# Patient Record
Sex: Female | Born: 1990
Health system: Southern US, Community
[De-identification: ages and names within clinical notes are randomized; demographics above are authoritative.]

## PROBLEM LIST (undated history)

## (undated) DIAGNOSIS — Z3490 Encounter for supervision of normal pregnancy, unspecified, unspecified trimester: Secondary | ICD-10-CM

## (undated) DIAGNOSIS — R7309 Other abnormal glucose: Secondary | ICD-10-CM

## (undated) DIAGNOSIS — E039 Hypothyroidism, unspecified: Secondary | ICD-10-CM

## (undated) DIAGNOSIS — O24419 Gestational diabetes mellitus in pregnancy, unspecified control: Secondary | ICD-10-CM

## (undated) DIAGNOSIS — F909 Attention-deficit hyperactivity disorder, unspecified type: Secondary | ICD-10-CM

## (undated) HISTORY — PX: NO PAST SURGERIES: SHX2092

## (undated) HISTORY — DX: Gestational diabetes mellitus in pregnancy, unspecified control: O24.419

---
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
## 2014-09-22 ENCOUNTER — Other Ambulatory Visit: Payer: Self-pay | Admitting: Family Medicine

## 2014-09-22 DIAGNOSIS — R17 Unspecified jaundice: Secondary | ICD-10-CM

## 2014-09-26 ENCOUNTER — Encounter (INDEPENDENT_AMBULATORY_CARE_PROVIDER_SITE_OTHER): Payer: Self-pay

## 2014-09-26 ENCOUNTER — Ambulatory Visit
Admission: RE | Admit: 2014-09-26 | Discharge: 2014-09-26 | Disposition: A | Discharge status: Home / Self Care | Payer: BC Managed Care – PPO | Source: Ambulatory Visit | Attending: Family Medicine | Admitting: Family Medicine

## 2014-09-26 DIAGNOSIS — R17 Unspecified jaundice: Secondary | ICD-10-CM

## 2015-04-07 ENCOUNTER — Emergency Department (HOSPITAL_COMMUNITY)
Admission: EM | Admit: 2015-04-07 | Discharge: 2015-04-07 | Disposition: A | Discharge status: Home / Self Care | Payer: BC Managed Care – PPO | Attending: Emergency Medicine | Admitting: Emergency Medicine

## 2015-04-07 ENCOUNTER — Encounter (HOSPITAL_COMMUNITY): Payer: Self-pay | Admitting: Emergency Medicine

## 2015-04-07 DIAGNOSIS — F10959 Alcohol use, unspecified with alcohol-induced psychotic disorder, unspecified: Secondary | ICD-10-CM

## 2015-04-07 DIAGNOSIS — F1310 Sedative, hypnotic or anxiolytic abuse, uncomplicated: Secondary | ICD-10-CM | POA: Diagnosis not present

## 2015-04-07 DIAGNOSIS — F1010 Alcohol abuse, uncomplicated: Secondary | ICD-10-CM | POA: Diagnosis present

## 2015-04-07 LAB — COMPREHENSIVE METABOLIC PANEL
ALK PHOS: 71 U/L (ref 39–117)
ALT: 20 U/L (ref 0–35)
AST: 29 U/L (ref 0–37)
Albumin: 4.8 g/dL (ref 3.5–5.2)
Anion gap: 9 (ref 5–15)
BILIRUBIN TOTAL: 0.5 mg/dL (ref 0.3–1.2)
BUN: 8 mg/dL (ref 6–23)
CHLORIDE: 114 mmol/L — AB (ref 96–112)
CO2: 24 mmol/L (ref 19–32)
CREATININE: 0.6 mg/dL (ref 0.50–1.10)
Calcium: 8.8 mg/dL (ref 8.4–10.5)
GFR calc Af Amer: >90 mL/min (ref >90)
GFR calc non Af Amer: >90 mL/min (ref >90)
Glucose, Bld: 108 mg/dL — ABNORMAL HIGH (ref 70–99)
Potassium: 3.6 mmol/L (ref 3.5–5.1)
Sodium: 147 mmol/L — ABNORMAL HIGH (ref 135–145)
Total Protein: 7.4 g/dL (ref 6.0–8.3)

## 2015-04-07 LAB — RAPID URINE DRUG SCREEN, HOSP PERFORMED
AMPHETAMINES: NOT DETECTED
BARBITURATES: NOT DETECTED
BENZODIAZEPINES: POSITIVE — AB
Cocaine: NOT DETECTED
Opiates: NOT DETECTED
Tetrahydrocannabinol: NOT DETECTED

## 2015-04-07 LAB — CBC
HCT: 43 % (ref 36.0–46.0)
Hemoglobin: 14.4 g/dL (ref 12.0–15.0)
MCH: 31.8 pg (ref 26.0–34.0)
MCHC: 33.5 g/dL (ref 30.0–36.0)
MCV: 94.9 fL (ref 78.0–100.0)
PLATELETS: 171 10*3/uL (ref 150–400)
RBC: 4.53 MIL/uL (ref 3.87–5.11)
RDW: 13.6 % (ref 11.5–15.5)
WBC: 6.7 10*3/uL (ref 4.0–10.5)

## 2015-04-07 LAB — I-STAT BETA HCG BLOOD, ED (MC, WL, AP ONLY): I-stat hCG, quantitative: <5 m[IU]/mL (ref <5)

## 2015-04-07 LAB — ACETAMINOPHEN LEVEL: Acetaminophen (Tylenol), Serum: <10 ug/mL — ABNORMAL LOW (ref 10–30)

## 2015-04-07 LAB — SALICYLATE LEVEL

## 2015-04-07 LAB — ETHANOL: Alcohol, Ethyl (B): 366 mg/dL — ABNORMAL HIGH (ref 0–9)

## 2015-04-07 MED ORDER — SODIUM CHLORIDE 0.9 % IV BOLUS (SEPSIS)
1000.0000 mL | Freq: Once | INTRAVENOUS | Status: AC
  Administered 2015-04-07: 1000 mL via INTRAVENOUS

## 2015-04-07 MED ORDER — SODIUM CHLORIDE 0.9 % IV SOLN
Freq: Once | INTRAVENOUS | Status: AC
  Administered 2015-04-07: 01:00:00 via INTRAVENOUS

## 2015-04-07 MED ORDER — LORAZEPAM 2 MG/ML IJ SOLN
1.0000 mg | Freq: Once | INTRAMUSCULAR | Status: AC
  Administered 2015-04-07: 1 mg via INTRAVENOUS
  Filled 2015-04-07: qty 1

## 2015-04-07 NOTE — ED Provider Notes (Signed)
CSN: [id number]     Arrival date & time 04/07/15  0015 History   First MD Initiated Contact with Patient 04/07/15 0043     Chief Complaint  Patient presents with  . Withdrawls     (Consider location/radiation/quality/duration/timing/severity/associated sxs/prior Treatment) HPI Comments: Patient is a 24 year old female with no known past medical history who presents to the emergency department via EMS for further evaluation of erratic behavior. Per EMS, patient was found by family acting erratically. This was thought to be secondary to alcohol withdrawal. EMS reports auditory hallucinations. She was given 5 mg midazolam en route. Patient states that she has been binge drinking over the past 4-5 days. She states that she drinks wine, whiskey, and tequila. She cannot specify as to the last time she drank alcohol today. She does report a history of cocaine use, but states that she has not used cocaine in weeks. She denies any marijuana use. She states that she is not in any pain. No SI/HI.  The history is provided by the patient. No language interpreter was used.    History reviewed. No pertinent past medical history. History reviewed. No pertinent past surgical history. History reviewed. No pertinent family history. History  Substance Use Topics  . Smoking status: Not on file  . Smokeless tobacco: Not on file  . Alcohol Use: Yes   OB History    No data available      Review of Systems  Psychiatric/Behavioral: Positive for hallucinations and behavioral problems. Negative for suicidal ideas.  All other systems reviewed and are negative.   Allergies  Gluten meal  Home Medications   Prior to Admission medications   Not on File   BP 101/67 mmHg  Pulse 90  Temp(Src) 98.9 F (37.2 C) (Oral)  Resp 18  SpO2 99%   Physical Exam  Constitutional: She is oriented to person, place, and time. She appears well-developed and well-nourished. No distress.  Nontoxic/nonseptic appearing.  Patient is moving around vigorously in the bed.  HENT:  Head: Normocephalic and atraumatic.  Eyes: Conjunctivae and EOM are normal. No scleral icterus.  Neck: Normal range of motion.  Cardiovascular: Regular rhythm and intact distal pulses.   Tachycardic rate  Pulmonary/Chest: Breath sounds normal. No respiratory distress. She has no wheezes. She has no rales.  Lungs clear bilaterally. Chest expansion symmetric. Mildly hyperventilating while whining.  Abdominal: Soft. There is no tenderness.  Soft, nontender abdomen  Musculoskeletal: Normal range of motion.  Neurological: She is alert and oriented to person, place, and time. She exhibits normal muscle tone. Coordination normal.  GCS 15. Speech is goal oriented. Patient is alert and oriented 4. No focal neurologic deficits appreciated.  Skin: Skin is warm and dry. No rash noted. She is not diaphoretic. No erythema. No pallor.  Psychiatric: Her affect is inappropriate. Her speech is slurred. She is agitated and withdrawn. She expresses no homicidal and no suicidal ideation. She expresses no suicidal plans and no homicidal plans.  Nursing note and vitals reviewed.   ED Course  Procedures (including critical care time) Labs Review Labs Reviewed  ACETAMINOPHEN LEVEL - Abnormal; Notable for the following:    Acetaminophen (Tylenol), Serum <10.0 (*)    All other components within normal limits  COMPREHENSIVE METABOLIC PANEL - Abnormal; Notable for the following:    Sodium 147 (*)    Chloride 114 (*)    Glucose, Bld 108 (*)    All other components within normal limits  ETHANOL - Abnormal; Notable for the following:  Alcohol, Ethyl (B) 366 (*)    All other components within normal limits  CBC  SALICYLATE LEVEL  URINE RAPID DRUG SCREEN (HOSP PERFORMED)  I-STAT BETA HCG BLOOD, ED ([facility], [facility], [facility] ONLY)    Imaging Review No results found.   EKG Interpretation None      MDM   Final diagnoses:  Alcohol use with alcohol-induced  psychotic disorder, with unspecified complication    24 year old female presents to the emergency department claiming to be in withdrawals. Ethanol 366. Symptoms most c/w alcohol induced psychosis. Agitation and whining as well as tachycardia improved with  Ativan. Patient sleeping and in no distress. Plan includes further sobering. Patient signed out to [facility], PA-C at shift change who will follow and disposition appropriately.   Filed Vitals:   04/07/15 0230 04/07/15 0300 04/07/15 0415 04/07/15 0558  BP: 101/67  Pulse: 78 78 69 90  Temp:      TempSrc:   Other (Comment)   Resp: 21   18  SpO2: 92% 92% 98% 99%     Antony Madura, PA-C 04/07/15 1610  Mirian Mo, MD 04/11/15 [phone]

## 2015-04-07 NOTE — Discharge Instructions (Signed)
Alcohol Intoxication °Alcohol intoxication occurs when the amount of alcohol that a person has consumed impairs his or her ability to mentally and physically function. Alcohol directly impairs the normal chemical activity of the brain. Drinking large amounts of alcohol can lead to changes in mental function and behavior, and it can cause many physical effects that can be harmful.  °Alcohol intoxication can range in severity from mild to very severe. Various factors can affect the level of intoxication that occurs, such as the person's age, gender, weight, frequency of alcohol consumption, and the presence of other medical conditions (such as diabetes, seizures, or heart conditions). Dangerous levels of alcohol intoxication may occur when people drink large amounts of alcohol in a short period (binge drinking). Alcohol can also be especially dangerous when combined with certain prescription medicines or "recreational" drugs. °SIGNS AND SYMPTOMS °Some common signs and symptoms of mild alcohol intoxication include: °· Loss of coordination. °· Changes in mood and behavior. °· Impaired judgment. °· Slurred speech. °As alcohol intoxication progresses to more severe levels, other signs and symptoms will appear. These may include: °· Vomiting. °· Confusion and impaired memory. °· Slowed breathing. °· Seizures. °· Loss of consciousness. °DIAGNOSIS  °Your health care provider will take a medical history and perform a physical exam. You will be asked about the amount and type of alcohol you have consumed. Blood tests will be done to measure the concentration of alcohol in your blood. In many places, your blood alcohol level must be lower than 80 mg/dL (0.08%) to legally drive. However, many dangerous effects of alcohol can occur at much lower levels.  °TREATMENT  °People with alcohol intoxication often do not require treatment. Most of the effects of alcohol intoxication are temporary, and they go away as the alcohol naturally  leaves the body. Your health care provider will monitor your condition until you are stable enough to go home. Fluids are sometimes given through an IV access tube to help prevent dehydration.  °HOME CARE INSTRUCTIONS °· Do not drive after drinking alcohol. °· Stay hydrated. Drink enough water and fluids to keep your urine clear or pale yellow. Avoid caffeine.   °· Only take over-the-counter or prescription medicines as directed by your health care provider.   °SEEK MEDICAL CARE IF:  °· You have persistent vomiting.   °· You do not feel better after a few days. °· You have frequent alcohol intoxication. Your health care provider can help determine if you should see a substance use treatment counselor. °SEEK IMMEDIATE MEDICAL CARE IF:  °· You become shaky or tremble when you try to stop drinking.   °· You shake uncontrollably (seizure).   °· You throw up (vomit) blood. This may be bright red or may look like black coffee grounds.   °· You have blood in your stool. This may be bright red or may appear as a black, tarry, bad smelling stool.   °· You become lightheaded or faint.   °MAKE SURE YOU:  °· Understand these instructions. °· Will watch your condition. °· Will get help right away if you are not doing well or get worse. °Document Released: 09/07/2005 Document Revised: 07/31/2013 Document Reviewed: 05/03/2013 °ExitCare® Patient Information ©2015 [employer]. This information is not intended to replace advice given to you by your health care provider. Make sure you discuss any questions you have with your health care provider. ° °How Much is Too Much Alcohol? °Drinking too much alcohol can cause injury, accidents, and health problems. These types of problems can include:  °· Car   crashes.  Falls.  Family fighting (domestic violence).  Drowning.  Fights.  Injuries.  Burns.  Damage to certain organs.  Having a baby with birth defects. ONE DRINK CAN BE TOO MUCH WHEN YOU ARE:  Working.  Pregnant  or breastfeeding.  Taking medicines. Ask your doctor.  Driving or planning to drive. WHAT IS A STANDARD DRINK?   1 regular beer (12 ounces or 360 milliliters).  1 glass of wine (5 ounces or 150 milliliters).  1 shot of liquor (1.5 ounces or 45 milliliters). BLOOD ALCOHOL LEVELS   .00 A person is sober.  Marland Kitchen.03 A person has no trouble keeping balance, talking, or seeing right, but a "buzz" may be felt.  Marland Kitchen.05 A person feels "buzzed" and relaxed.  Marland Kitchen.08 or .10  A person is drunk. He or she has trouble talking, seeing right, and keeping his or her balance.  .15 A person loses body control and may pass out (blackout).  .20 A person has trouble walking (staggering) and throws up (vomits).  .30 A person will pass out (unconscious).  .40+ A person will be in a coma. Death is possible. If you or someone you know has a drinking problem, get help from a doctor.  Document Released: 09/24/2009 Document Revised: 02/20/2012 Document Reviewed: 09/24/2009 [facility] Patient Information 2015 [place]. This information is not intended to replace advice given to you by your health care provider. Make sure you discuss any questions you have with your health care provider.

## 2015-04-07 NOTE — ED Provider Notes (Signed)
  Patient care [facility] Humes, [place]-C.  Results for orders placed or performed during the hospital encounter of 04/07/15  Acetaminophen level  Result Value Ref Range   Acetaminophen (Tylenol), Serum <10.0 (L) 10 - 30 ug/mL  CBC  Result Value Ref Range   WBC 6.7 4.0 - 10.5 K/uL   RBC 4.53 3.87 - 5.11 MIL/uL   Hemoglobin 14.4 12.0 - 15.0 g/dL   HCT 11.943.0 14.736.0 - 82.946.0 %   MCV 94.9 78.0 - 100.0 fL   MCH 31.8 26.0 - 34.0 pg   MCHC 33.5 30.0 - 36.0 g/dL   RDW 56.213.6 13.011.5 - 86.515.5 %   Platelets 171 150 - 400 K/uL  Comprehensive metabolic panel  Result Value Ref Range   Sodium 147 (H) 135 - 145 mmol/L   Potassium 3.6 3.5 - 5.1 mmol/L   Chloride 114 (H) 96 - 112 mmol/L   CO2 24 19 - 32 mmol/L   Glucose, Bld 108 (H) 70 - 99 mg/dL   BUN 8 6 - 23 mg/dL   Creatinine, Ser 7.840.60 0.50 - 1.10 mg/dL   Calcium 8.8 8.4 - 69.610.5 mg/dL   Total Protein 7.4 6.0 - 8.3 g/dL   Albumin 4.8 3.5 - 5.2 g/dL   AST 29 0 - 37 U/L   ALT 20 0 - 35 U/L   Alkaline Phosphatase 71 39 - 117 U/L   Total Bilirubin 0.5 0.3 - 1.2 mg/dL   GFR calc non Af Amer >90 >90 mL/min   GFR calc Af Amer >90 >90 mL/min   Anion gap 9 5 - 15  Ethanol (ETOH)  Result Value Ref Range   Alcohol, Ethyl (B) 366 (H) 0 - 9 mg/dL  Salicylate level  Result Value Ref Range   Salicylate Lvl <4.0 2.8 - 20.0 mg/dL  I-Stat Beta hCG blood, ED ([facility], [facility], [facility] only)  Result Value Ref Range   I-stat hCG, quantitative <5.0 <5 mIU/mL   Comment 3           No results found.  Filed Vitals:   04/07/15 0816  BP: 94/53  Pulse: 81  Temp: 98.2 F (36.8 C)  Resp: 16   10:04 AM Patient ambulated to restroom without difficulty.   1. Alcohol use with alcohol-induced psychotic disorder, with unspecified complication    Return precautions discussed. Patient is agreeable to plan. Patient is stable at time of discharge   Francee PiccoloJennifer Yelina Sarratt, PA-C 04/07/15 1604  Mirian MoMatthew Gentry, MD 04/11/15 [phone]

## 2015-04-07 NOTE — ED Notes (Signed)
Patient now awake asking for water. Patient fluid challenged.

## 2015-04-07 NOTE — ED Notes (Addendum)
Pt was found by family acting erratic secondary to alcohol withdrawal. Hx of same. Unknown when the last time she drank. Auditory hallucinations. Also possibly a cocaine user. Given 5mg  midazolam en route.

## 2015-04-07 NOTE — ED Notes (Signed)
Bed: WA17 Expected date:  Expected time:  Means of arrival:  Comments: EMS 56F combative, alcohol detox

## 2015-04-07 NOTE — ED Notes (Signed)
Attempted to two person assist and ambulate patient. Patient unwilling to attempt to ambulate.

## 2015-10-16 IMAGING — US US ABDOMEN LIMITED
1 series · 14 of 25 positions shown · non-contrast
Comparison: None.

CLINICAL DATA: Jaundice.

EXAM:
US ABDOMEN LIMITED - RIGHT UPPER QUADRANT

[Series 1: us abdomen limited · 0.22mm/px · 14 of 43 slices shown]
[im 1/43]
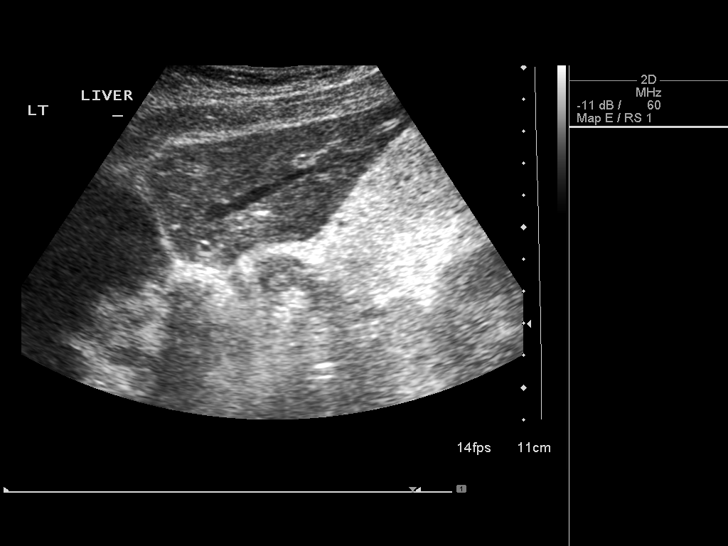
[im 4/43]
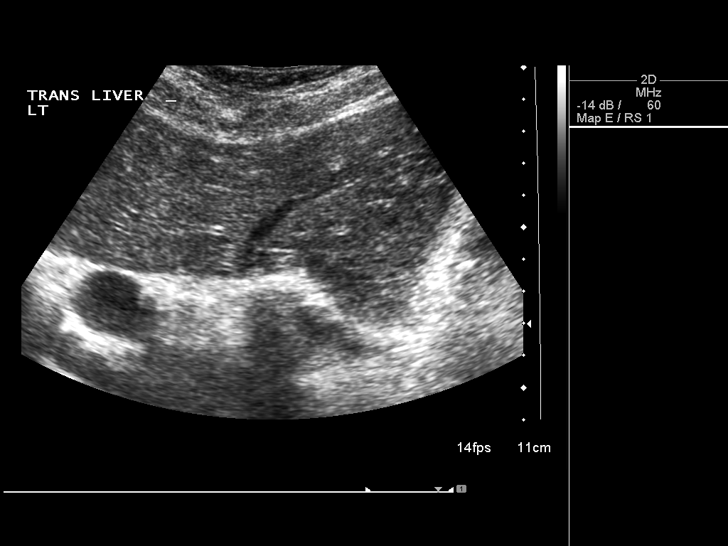
[im 8/43]
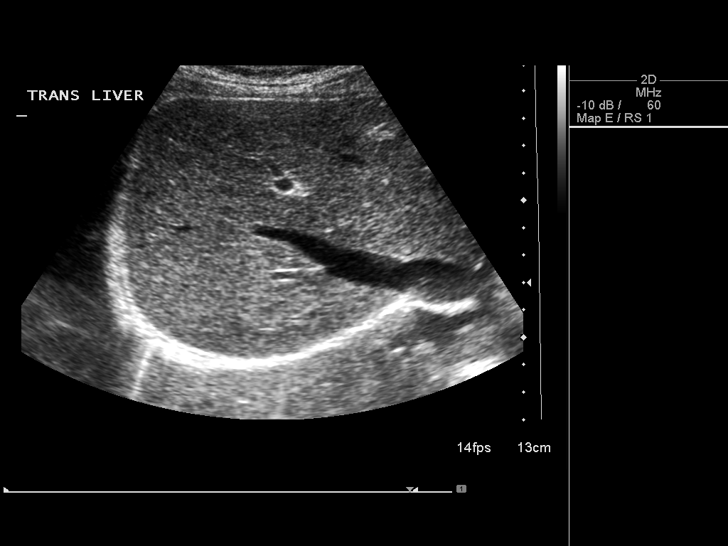
[im 11/43]
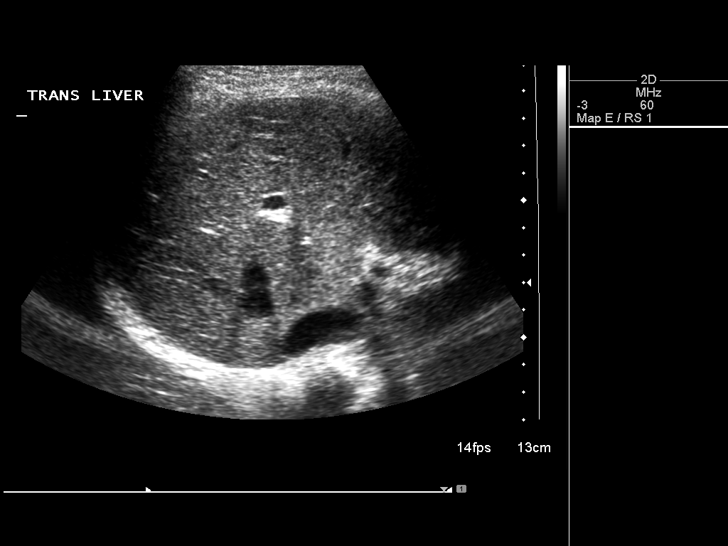
[im 15/43]
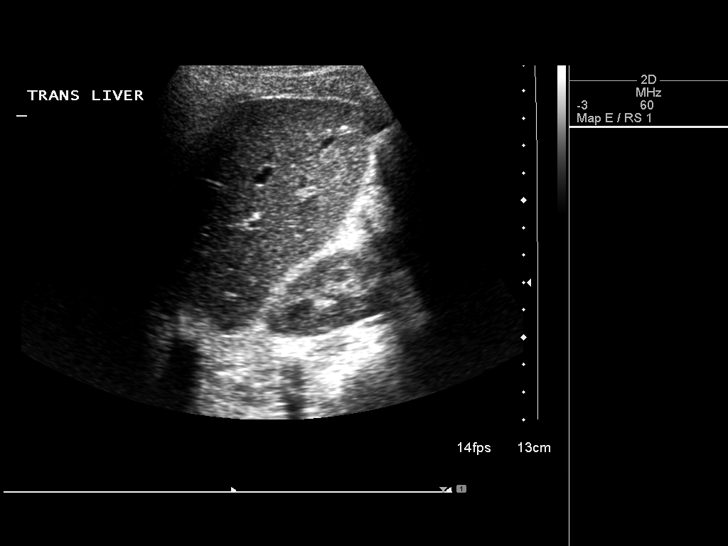
[im 16/43]
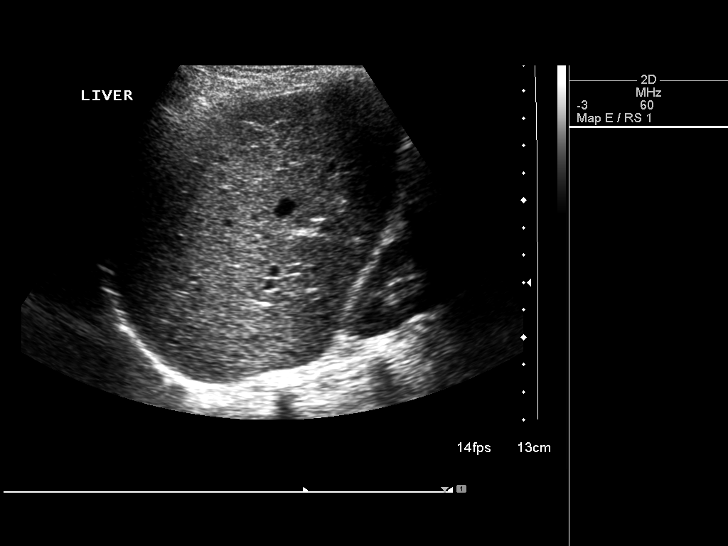
[im 20/43]
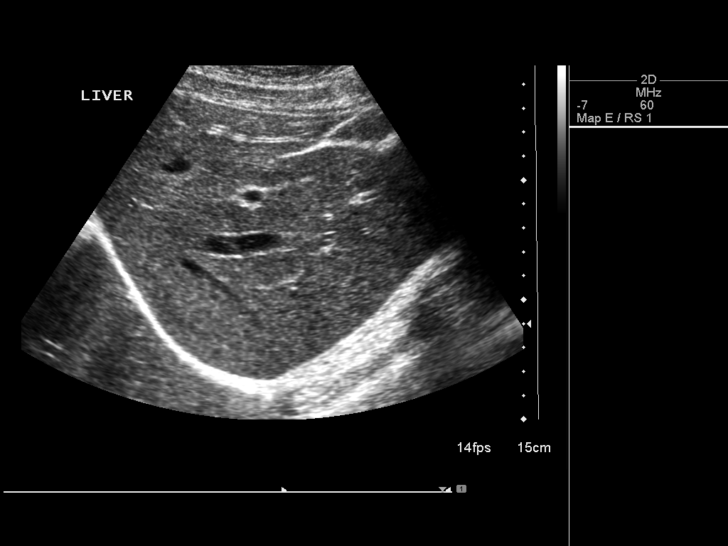
[im 23/43]
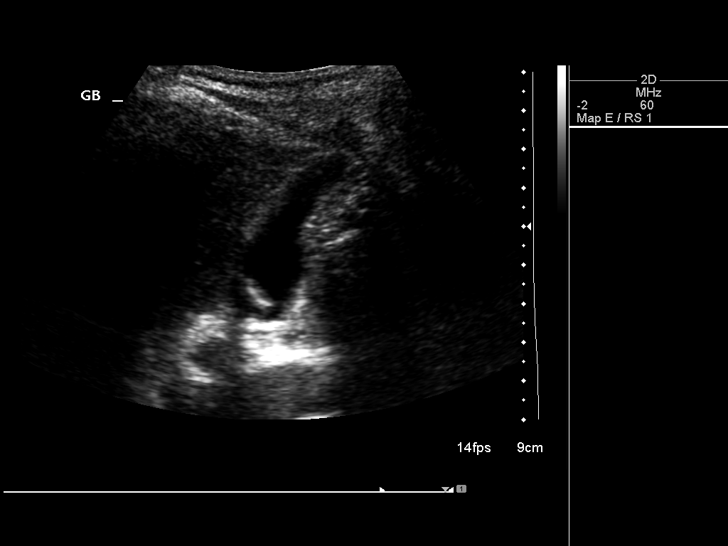
[im 27/43]
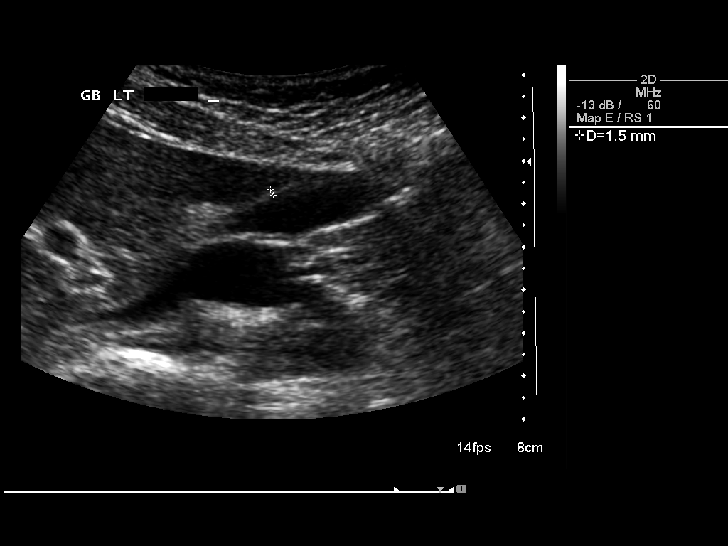
[im 29/43]
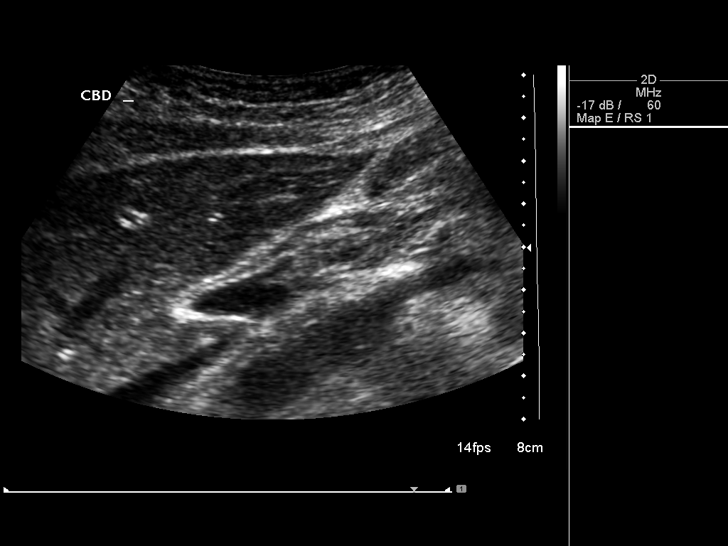
[im 32/43]
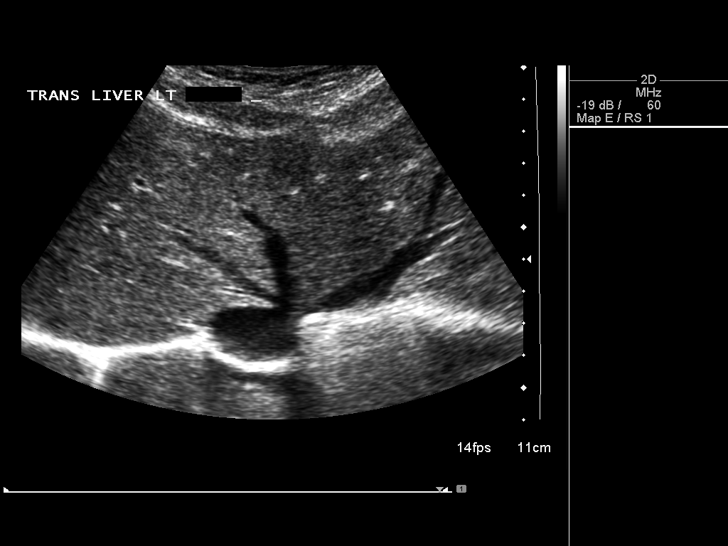
[im 36/43]
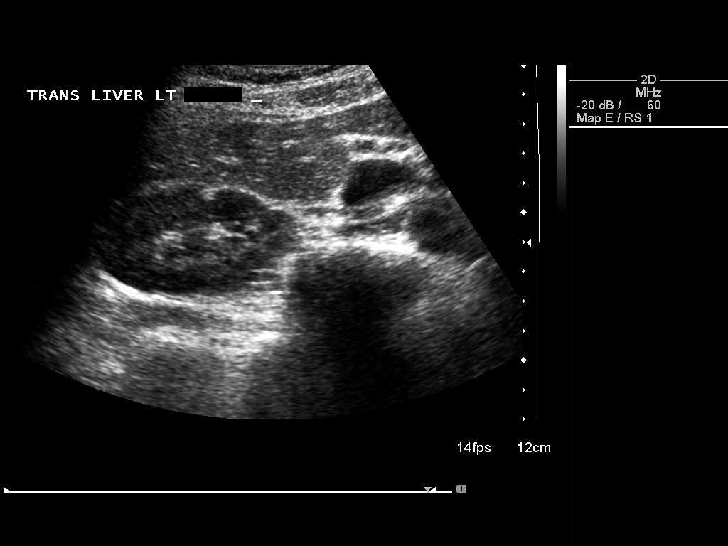
[im 39/43]
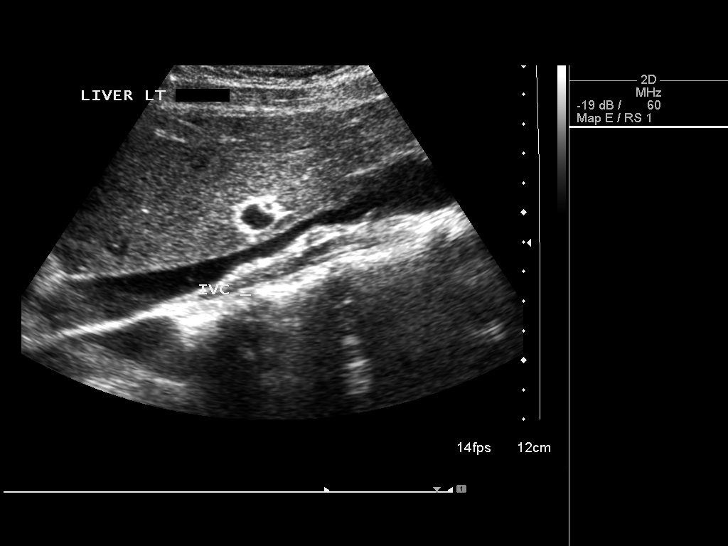
[im 43/43]
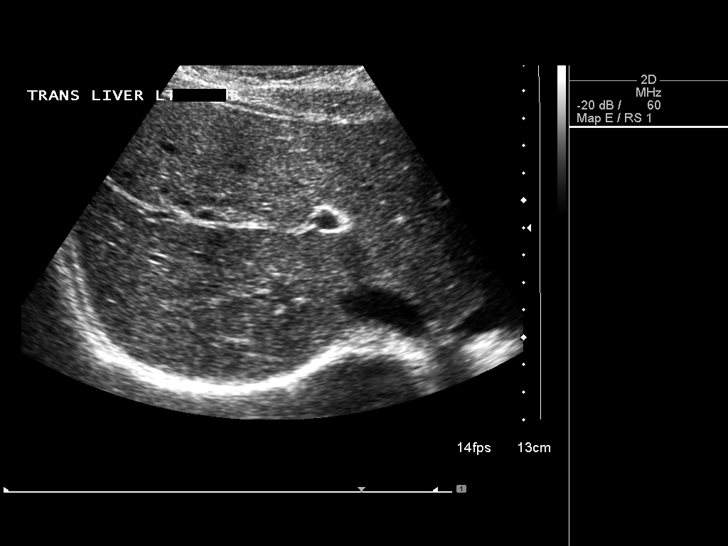

[14 of 25 positions shown; findings below may reference images not displayed]

FINDINGS: Gallbladder:

No gallstones or wall thickening visualized. No sonographic Murphy
sign noted.

Common bile duct:

Diameter: 1.9 mm which is within normal limits.

Liver:

"Starry sky" appearance of hepatic parenchyma is noted which is
nonspecific finding for possible hepatitis. No focal abnormality is
noted.
IMPRESSION: Possible diffuse hepatitis or other hepatocellular disease may be
present. No focal abnormality is noted.

## 2016-12-12 NOTE — L&D Delivery Note (Signed)
Delivery Note At 9:37 PM, on December 11, 2017, a viable female "Jean RosenthalJackson" was delivered via Vaginal, Spontaneous (Presentation: Direct Occiput Anterior with restitution to ROA). After delivery of the head, a shoulder dystocia noted.  Patient informed of dystocia presence and need for maneuvers.  McRoberts performed with good results allowing for anterior shoulder, posterior compound right hand, and posterior shoulder to be delivered.  Delivery of head to body in <45 seconds.  Infant with spontaneous cry and good tone.  Tactile stimulation and bulb suction given by provider before being placed on mother's abdomen were nurses continued tactile stimulation.  Infant APGAR: 8, 9. Cord clamped, cut, and blood collected. Placenta delivered spontaneously and noted to be intact with 3VC upon inspection.  Vaginal inspection revealed a 1st degree perineal and bilateral labial lacerations that was repaired with 3-0 vicryl on CT-1 and SH respectively. ~767mL of 1% Lidocaine necessary for local anesthetic, but patient tolerated the procedure well. Fundus firm, at the umbilicus, and bleeding small.  Mother hemodynamically stable and infant skin to skin prior to provider exit.  Mother desires birth control, but is unsure of method.  Mother opts to breastfeed and family declines infant circumcision.  Infant measurements at one hour of life: 8lbs 3oz, 20.25in  Anesthesia:  Local, Epidural Episiotomy: None Lacerations: Labial;1st degree;Perineal Suture Repair: 3.0 vicryl Est. Blood Loss (mL): 300  Mom to postpartum.  Baby to Couplet care / Skin to Skin.  Cherre RobinsJessica L Aalyah Mansouri MSN, CNM 12/11/2017, 10:19 PM

## 2017-04-26 DIAGNOSIS — Z3490 Encounter for supervision of normal pregnancy, unspecified, unspecified trimester: Secondary | ICD-10-CM | POA: Insufficient documentation

## 2017-04-26 HISTORY — DX: Encounter for supervision of normal pregnancy, unspecified, unspecified trimester: Z34.90

## 2017-04-26 LAB — OB RESULTS CONSOLE HIV ANTIBODY (ROUTINE TESTING): HIV: NONREACTIVE

## 2017-04-26 LAB — OB RESULTS CONSOLE ABO/RH: RH Type: POSITIVE

## 2017-04-26 LAB — OB RESULTS CONSOLE ANTIBODY SCREEN: ANTIBODY SCREEN: NEGATIVE

## 2017-04-26 LAB — OB RESULTS CONSOLE RUBELLA ANTIBODY, IGM: RUBELLA: IMMUNE

## 2017-04-26 LAB — OB RESULTS CONSOLE RPR: RPR: NONREACTIVE

## 2017-04-26 LAB — OB RESULTS CONSOLE GC/CHLAMYDIA
Chlamydia: NEGATIVE
Gonorrhea: NEGATIVE

## 2017-04-26 LAB — OB RESULTS CONSOLE HEPATITIS B SURFACE ANTIGEN: HEP B S AG: NEGATIVE

## 2017-10-02 DIAGNOSIS — R7309 Other abnormal glucose: Secondary | ICD-10-CM | POA: Insufficient documentation

## 2017-10-02 HISTORY — DX: Other abnormal glucose: R73.09

## 2017-12-11 ENCOUNTER — Inpatient Hospital Stay (HOSPITAL_COMMUNITY)
Admission: AD | Admit: 2017-12-11 | Discharge: 2017-12-11 | Disposition: A | Discharge status: Home / Self Care | Payer: BLUE CROSS/BLUE SHIELD | Source: Ambulatory Visit | Attending: Obstetrics & Gynecology | Admitting: Obstetrics & Gynecology

## 2017-12-11 ENCOUNTER — Encounter (HOSPITAL_COMMUNITY): Payer: Self-pay | Admitting: *Deleted

## 2017-12-11 ENCOUNTER — Inpatient Hospital Stay (HOSPITAL_COMMUNITY): Payer: BLUE CROSS/BLUE SHIELD | Admitting: Anesthesiology

## 2017-12-11 ENCOUNTER — Inpatient Hospital Stay (HOSPITAL_COMMUNITY)
Admission: AD | Admit: 2017-12-11 | Discharge: 2017-12-13 | DRG: 807 | Disposition: A | Discharge status: Home / Self Care | Payer: BLUE CROSS/BLUE SHIELD | Source: Ambulatory Visit | Attending: Obstetrics & Gynecology | Admitting: Obstetrics & Gynecology

## 2017-12-11 DIAGNOSIS — Z3A39 39 weeks gestation of pregnancy: Secondary | ICD-10-CM

## 2017-12-11 DIAGNOSIS — Z87891 Personal history of nicotine dependence: Secondary | ICD-10-CM | POA: Diagnosis not present

## 2017-12-11 DIAGNOSIS — Z3483 Encounter for supervision of other normal pregnancy, third trimester: Secondary | ICD-10-CM | POA: Diagnosis present

## 2017-12-11 DIAGNOSIS — O479 False labor, unspecified: Secondary | ICD-10-CM

## 2017-12-11 LAB — CBC
HCT: 39.7 % (ref 36.0–46.0)
Hemoglobin: 14.1 g/dL (ref 12.0–15.0)
MCH: 33.5 pg (ref 26.0–34.0)
MCHC: 35.5 g/dL (ref 30.0–36.0)
MCV: 94.3 fL (ref 78.0–100.0)
PLATELETS: 144 10*3/uL — AB (ref 150–400)
RBC: 4.21 MIL/uL (ref 3.87–5.11)
RDW: 12.9 % (ref 11.5–15.5)
WBC: 13.1 10*3/uL — ABNORMAL HIGH (ref 4.0–10.5)

## 2017-12-11 LAB — TYPE AND SCREEN
ABO/RH(D): O POS
Antibody Screen: NEGATIVE

## 2017-12-11 LAB — ABO/RH: ABO/RH(D): O POS

## 2017-12-11 MED ORDER — EPHEDRINE 5 MG/ML INJ: 10.0000 mg | INTRAVENOUS | Status: DC | PRN

## 2017-12-11 MED ORDER — OXYTOCIN BOLUS FROM INFUSION
500.0000 mL | Freq: Once | INTRAVENOUS | Status: AC
  Administered 2017-12-11: 500 mL via INTRAVENOUS

## 2017-12-11 MED ORDER — IBUPROFEN 600 MG PO TABS
600.0000 mg | ORAL_TABLET | Freq: Four times a day (QID) | ORAL | Status: DC
  Administered 2017-12-12 – 2017-12-13 (×6): 600 mg via ORAL
  Filled 2017-12-11 (×6): qty 1

## 2017-12-11 MED ORDER — DIPHENHYDRAMINE HCL 50 MG/ML IJ SOLN: 12.5000 mg | INTRAMUSCULAR | Status: DC | PRN

## 2017-12-11 MED ORDER — SOD CITRATE-CITRIC ACID 500-334 MG/5ML PO SOLN: 30.0000 mL | ORAL | Status: DC | PRN

## 2017-12-11 MED ORDER — FLEET ENEMA 7-19 GM/118ML RE ENEM: 1.0000 | ENEMA | RECTAL | Status: DC | PRN

## 2017-12-11 MED ORDER — DIBUCAINE 1 % RE OINT: 1.0000 "application " | TOPICAL_OINTMENT | RECTAL | Status: DC | PRN

## 2017-12-11 MED ORDER — LACTATED RINGERS IV SOLN
INTRAVENOUS | Status: DC
  Administered 2017-12-11: 1000 mL/h via INTRAVENOUS

## 2017-12-11 MED ORDER — WITCH HAZEL-GLYCERIN EX PADS: 1.0000 "application " | MEDICATED_PAD | CUTANEOUS | Status: DC | PRN

## 2017-12-11 MED ORDER — BENZOCAINE-MENTHOL 20-0.5 % EX AERO
1.0000 "application " | INHALATION_SPRAY | CUTANEOUS | Status: DC | PRN
  Administered 2017-12-12: 1 via TOPICAL
  Filled 2017-12-11: qty 56

## 2017-12-11 MED ORDER — ACETAMINOPHEN 325 MG PO TABS: 650.0000 mg | ORAL_TABLET | ORAL | Status: DC | PRN

## 2017-12-11 MED ORDER — PHENYLEPHRINE 40 MCG/ML (10ML) SYRINGE FOR IV PUSH (FOR BLOOD PRESSURE SUPPORT)
80.0000 ug | PREFILLED_SYRINGE | INTRAVENOUS | Status: DC | PRN
  Filled 2017-12-11: qty 5

## 2017-12-11 MED ORDER — LACTATED RINGERS IV SOLN: 500.0000 mL | INTRAVENOUS | Status: DC | PRN

## 2017-12-11 MED ORDER — ONDANSETRON HCL 4 MG PO TABS: 4.0000 mg | ORAL_TABLET | ORAL | Status: DC | PRN

## 2017-12-11 MED ORDER — SIMETHICONE 80 MG PO CHEW: 80.0000 mg | CHEWABLE_TABLET | ORAL | Status: DC | PRN

## 2017-12-11 MED ORDER — EPHEDRINE 5 MG/ML INJ
10.0000 mg | INTRAVENOUS | Status: DC | PRN
  Filled 2017-12-11: qty 2

## 2017-12-11 MED ORDER — LACTATED RINGERS IV SOLN: 500.0000 mL | Freq: Once | INTRAVENOUS | Status: DC

## 2017-12-11 MED ORDER — PRENATAL MULTIVITAMIN CH
1.0000 | ORAL_TABLET | Freq: Every day | ORAL | Status: DC
  Administered 2017-12-12 – 2017-12-13 (×2): 1 via ORAL
  Filled 2017-12-11 (×2): qty 1

## 2017-12-11 MED ORDER — ZOLPIDEM TARTRATE 5 MG PO TABS: 5.0000 mg | ORAL_TABLET | Freq: Every evening | ORAL | Status: DC | PRN

## 2017-12-11 MED ORDER — LIDOCAINE HCL (PF) 1 % IJ SOLN
30.0000 mL | INTRAMUSCULAR | Status: DC | PRN
  Administered 2017-12-11: 30 mL via SUBCUTANEOUS
  Filled 2017-12-11: qty 30

## 2017-12-11 MED ORDER — ONDANSETRON HCL 4 MG/2ML IJ SOLN
4.0000 mg | INTRAMUSCULAR | Status: DC | PRN
Start: 2017-12-11 — End: 2017-12-13

## 2017-12-11 MED ORDER — LIDOCAINE HCL (PF) 1 % IJ SOLN
INTRAMUSCULAR | Status: DC | PRN
  Administered 2017-12-11: 2 mL
  Administered 2017-12-11: 3 mL
  Administered 2017-12-11: 5 mL

## 2017-12-11 MED ORDER — OXYTOCIN 40 UNITS IN LACTATED RINGERS INFUSION - SIMPLE MED
2.5000 [IU]/h | INTRAVENOUS | Status: DC
  Filled 2017-12-11: qty 1000

## 2017-12-11 MED ORDER — PHENYLEPHRINE 40 MCG/ML (10ML) SYRINGE FOR IV PUSH (FOR BLOOD PRESSURE SUPPORT)
80.0000 ug | PREFILLED_SYRINGE | INTRAVENOUS | Status: DC | PRN
  Administered 2017-12-11: 40 ug via INTRAVENOUS
  Filled 2017-12-11: qty 10
  Filled 2017-12-11: qty 5

## 2017-12-11 MED ORDER — TETANUS-DIPHTH-ACELL PERTUSSIS 5-2.5-18.5 LF-MCG/0.5 IM SUSP: 0.5000 mL | Freq: Once | INTRAMUSCULAR | Status: DC

## 2017-12-11 MED ORDER — PHENYLEPHRINE 40 MCG/ML (10ML) SYRINGE FOR IV PUSH (FOR BLOOD PRESSURE SUPPORT)
80.0000 ug | PREFILLED_SYRINGE | INTRAVENOUS | Status: DC | PRN
Start: 2017-12-11 — End: 2017-12-11

## 2017-12-11 MED ORDER — DIPHENHYDRAMINE HCL 25 MG PO CAPS: 25.0000 mg | ORAL_CAPSULE | Freq: Four times a day (QID) | ORAL | Status: DC | PRN

## 2017-12-11 MED ORDER — COCONUT OIL OIL: 1.0000 "application " | TOPICAL_OIL | Status: DC | PRN

## 2017-12-11 MED ORDER — ONDANSETRON HCL 4 MG/2ML IJ SOLN: 4.0000 mg | Freq: Four times a day (QID) | INTRAMUSCULAR | Status: DC | PRN

## 2017-12-11 MED ORDER — FENTANYL 2.5 MCG/ML BUPIVACAINE 1/10 % EPIDURAL INFUSION (WH - ANES)
14.0000 mL/h | INTRAMUSCULAR | Status: DC | PRN
  Administered 2017-12-11 (×3): 14 mL/h via EPIDURAL
  Filled 2017-12-11 (×2): qty 100

## 2017-12-11 MED ORDER — SENNOSIDES-DOCUSATE SODIUM 8.6-50 MG PO TABS
2.0000 | ORAL_TABLET | ORAL | Status: DC
  Administered 2017-12-13: 2 via ORAL
  Filled 2017-12-11: qty 2

## 2017-12-11 NOTE — MAU Note (Signed)
Pt started having ctx at 0500, no LOF or bleeding.

## 2017-12-11 NOTE — H&P (Addendum)
Andrea Carter is a 26 y.o. female presenting for labor at 39 weeks 2 days EGA.  [id number] .  Patient with contractions and cervical change, now at 4.5 cm dilation.  LMP 03/10/17 EDC 12/15/2017 by LMP c/w first trimester ultrasound.    OB History    Gravida Para Term Preterm AB Living   1             SAB TAB Ectopic Multiple Live Births                 No past medical history on file. No past surgical history on file. Family History: family history is not on file. Social History:  reports that  She quit smoking when she found out she was pregnant.  She reports that she drinks alcohol.  She does not use other drugs.       Maternal Diabetes: No Genetic Screening: Not done.  Maternal Ultrasounds/Referrals: Normal Fetal Ultrasounds or other Referrals:  None Maternal Substance Abuse:  No Significant Maternal Medications:  None Significant Maternal Lab Results:  Lab values include: Group B Strep negative, Other:  Elevated 1hr GCT, patient declined 3 hr  GTT.  Normal hemoglobin A1C.   Other Comments:  None  ROS Constitutional: Denies fevers/chills Cardiovascular: Denies chest pain or palpitations Pulmonary: Denies coughing or wheezing Gastrointestinal: Denies nausea, vomiting or diarrhea Genitourinary: Denies pelvic pain, unusual vaginal bleeding, unusual vaginal discharge, dysuria, urgency or frequency. With contractions.  Musculoskeletal: Denies muscle or joint aches and pain.  Neurology: Denies abnormal sensations such as tingling or numbness.     Maternal Exam:  Uterine Assessment: Contraction strength is firm.  Contraction duration is 5 minutes. Contraction frequency is regular.   Abdomen: Patient reports no abdominal tenderness. Estimated fetal weight is 7.5lbs.   Fetal presentation: vertex  Introitus: Normal vulva. Normal vagina.  Pelvis: adequate for delivery.      Physical Exam  Dilation: 4.5 Effacement (%): 90 Station: -3 Exam by:: Ginnie Smartachel Schmidt RN Blood pressure  126/90, pulse 82.   Weight gain in pregnancy: 29 lbs. BMI 29.  Constitutional: She is oriented to person, place, and time. She appears well-developed and well-nourished.  HENT:  Head: Normocephalic and atraumatic.  Neck: Normal range of motion.  Cardiovascular: Normal rate, regular rhythm and normal heart sounds.   Respiratory: Effort normal and breath sounds normal.  GI: Soft. Genitourinary: Gravid uterus.  Neurological: She is alert and oriented to person, place, and time.  Skin: Skin is warm and dry.  Psychiatric: She has a normal mood and affect. Her behavior is normal.   Prenatal labs: ABO, Rh: O/Positive/-- (05/16 0000) Antibody: Negative (05/16 0000) Rubella: Immune (05/16 0000) RPR: Nonreactive (05/16 0000)  HBsAg: Negative (05/16 0000)  HIV: Non-reactive (05/16 0000)  GBS:   Negative  S/p TDAP and flu vaccines in office.   Assessment/Plan: 26 y/o G3P0020 @ 39 weeks 2 days EGA here for labor  [facility]  Clear liquid diet Pain management as needed- may get epidural Fetal monitoring Labs: CBC, RPR, type and screen.  Anticipate NSVD.   Konrad FelixKULWA,Edyn Popoca WAKURU , MD.  12/11/2017, 2:20 PM

## 2017-12-11 NOTE — MAU Note (Signed)
I have communicated with Dr Sallye OberKulwa and reviewed vital signs:  Vitals:   12/11/17 0938 12/11/17 1151  BP: 113/77 113/69  Pulse: 83 89  Resp:  18  Temp:      Vaginal exam:  Dilation: 3 Effacement (%): 70 Cervical Position: Middle Station: -3 Presentation: Vertex Exam by:: Ginnie Smartachel Keath Matera RN,   Also reviewed contraction pattern and that non-stress test is reactive.  It has been documented that patient is contracting every 3 minutes with no cervical change over 1.5 hours not indicating active labor.  Patient denies any other complaints.  Based on this report provider has given order for discharge.  A discharge order and diagnosis entered by a provider.   Labor discharge instructions reviewed with patient.

## 2017-12-11 NOTE — Progress Notes (Signed)
Earl GalaSarah Brummond MRN: [id number]  Subjective: -Nurse call reports patient with c/o rectal pressure and VE reveal C/C/0,+1.  In room to assess.  Patient reports rectal pressure with contractions and states that she "could push."  However, after discussion patient admits that she does not have overwhelming urge to push.  Objective: BP (!) 115/59   Pulse (!) 102   Temp (!) 97.4 F (36.3 C) (Axillary)   Ht 5' (1.524 m)   Wt 68 kg (150 lb)   SpO2 100%   BMI 29.29 kg/m  No intake/output data recorded. No intake/output data recorded.  Fetal Monitoring: FHT: 130 bpm, Mod Var, +Early Decels, +Accels UC: palpates strong    Vaginal Exam: SVE:   Dilation: 10 Effacement (%): 100 Station: 0 Exam by:: Minus Libertyhristy Leshowitz, RN Membranes:AROM x 2 hrs Internal Monitors: None  Augmentation/Induction: Pitocin:None Cytotec: None  Assessment:  IUP at 39.3wks Cat I FT  2nd Stage Labor Suspected OP positioning per Dr. Sallyanne HaversEK verbal report  Plan: -Discussed Dr. Sallyanne HaversEK suspicion of OP position and discussed difficulties associated with pushing with infant in said position. -Discussed need to be experiencing constant rectal pressure that increases with contractions -Patient verbalized understanding -Labor down x 1 hour  -Place on peanut and turn q 30 min to promote fetal rotation and descent -Dr. Carmela HurtE. Kulwa updated on patient status and advises 1.5 hours to labor down if necessary -Continue other mgmt as ordered  Valma CavaJessica L Althea Backs,MSN, CNM 12/11/2017, 7:45 PM

## 2017-12-11 NOTE — Anesthesia Preprocedure Evaluation (Signed)
Anesthesia Evaluation  Patient identified by MRN, date of birth, ID band Patient awake    Reviewed: Allergy & Precautions, Patient's Chart, lab work & pertinent test results  Airway Mallampati: II  TM Distance: >3 FB     Dental   Pulmonary neg pulmonary ROS,    Pulmonary exam normal        Cardiovascular negative cardio ROS Normal cardiovascular exam     Neuro/Psych negative neurological ROS     GI/Hepatic negative GI ROS, Neg liver ROS,   Endo/Other  negative endocrine ROS  Renal/GU negative Renal ROS     Musculoskeletal   Abdominal   Peds  Hematology  (+) Blood dyscrasia (Thrombocytopenia), ,   Anesthesia Other Findings   Reproductive/Obstetrics                             Lab Results  Component Value Date   WBC 13.1 (H) 12/11/2017   HGB 14.1 12/11/2017   HCT 39.7 12/11/2017   MCV 94.3 12/11/2017   PLT 144 (L) 12/11/2017    Anesthesia Physical Anesthesia Plan  ASA: II  Anesthesia Plan: Epidural   Post-op Pain Management:    Induction:   PONV Risk Score and Plan: Treatment may vary due to age or medical condition  Airway Management Planned: Natural Airway  Additional Equipment:   Intra-op Plan:   Post-operative Plan:   Informed Consent: I have reviewed the patients History and Physical, chart, labs and discussed the procedure including the risks, benefits and alternatives for the proposed anesthesia with the patient or authorized representative who has indicated his/her understanding and acceptance.     Plan Discussed with:   Anesthesia Plan Comments:         Anesthesia Quick Evaluation

## 2017-12-11 NOTE — Progress Notes (Signed)
Andrea GalaSarah Vanegas is a 26 y.o. G1P0 at 7977w3d admitted for active labor  Subjective: Patient is comfortable with epidural.   Objective: BP 113/89   Pulse 100   Temp (!) 97.4 F (36.3 C) (Axillary)   Ht 5' (1.524 m)   Wt 68 kg (150 lb)   SpO2 100%   BMI 29.29 kg/m  No intake/output data recorded. No intake/output data recorded.  FHT:  FHR: 120 bpm, variability: moderate,  accelerations:  Present,  decelerations:  Absent UC:   regular, every 2 to 5 minutes SVE:   Dilation: 7 Effacement (%): 100 Station: -1 Exam by:: Dr Sallye OberKulwa.  AROM performed, clear, copious fluid.   Labs: Lab Results  Component Value Date   WBC 13.1 (H) 12/11/2017   HGB 14.1 12/11/2017   HCT 39.7 12/11/2017   MCV 94.3 12/11/2017   PLT 144 (L) 12/11/2017    Assessment / Plan: Spontaneous labor, progressing normally,  Labor: Progressing normally and AROM performed.  Fetal Wellbeing:  Category I. Pain Control:  Epidural. I/D:  GBS negative. Anticipated MOD:  NSVD.  Konrad FelixKULWA,Catheline Hixon WAKURU, MD.  12/11/2017, 5:23 PM

## 2017-12-11 NOTE — Anesthesia Pain Management Evaluation Note (Signed)
  [facility] Pain Management Visit Note  Patient: Andrea GalaSarah Carter, 26 y.o., female  "Hello I am a [facility] of the anesthesia [facility]. We have an anesthesia team available at all times to provide care throughout the hospital, including epidural management and anesthesia for C-section. I don't know your plan for the delivery whether it a natural birth, water birth, IV sedation, nitrous supplementation, doula or epidural, but we want to meet your pain goals."   1.Was your pain managed to your expectations on prior hospitalizations?   No prior hospitalizations  2.What is your expectation for pain management during this hospitalization?     Epidural  3.How can we help you reach that goal? epidural  Record the patient's initial score and the patient's pain goal.   Pain: 10  Pain Goal: 5 [facility] wants you to be able to say your pain was always managed very well.  Kathlen Sakurai 12/11/2017

## 2017-12-11 NOTE — Anesthesia Procedure Notes (Signed)
Epidural Patient location during procedure: OB Start time: 12/11/2017 3:18 PM End time: 12/11/2017 3:28 PM  Staffing Anesthesiologist: Marcene DuosFitzgerald, Loni Abdon, MD  Preanesthetic Checklist Completed: patient identified, site marked, surgical consent, pre-op evaluation, timeout performed, IV checked, risks and benefits discussed and monitors and equipment checked  Epidural Patient position: sitting Prep: site prepped and draped and DuraPrep Patient monitoring: continuous pulse ox and blood pressure Approach: midline Location: L3-L4 Injection technique: LOR air  Needle:  Needle type: Tuohy  Needle gauge: 17 G Needle length: 9 cm and 9 Needle insertion depth: 6 cm Catheter type: closed end flexible Catheter size: 19 Gauge Catheter at skin depth: 11 cm Test dose: negative  Assessment Events: blood not aspirated, injection not painful, no injection resistance, negative IV test and no paresthesia

## 2017-12-12 LAB — CBC
HCT: 34.7 % — ABNORMAL LOW (ref 36.0–46.0)
Hemoglobin: 11.9 g/dL — ABNORMAL LOW (ref 12.0–15.0)
MCH: 32.5 pg (ref 26.0–34.0)
MCHC: 34.3 g/dL (ref 30.0–36.0)
MCV: 94.8 fL (ref 78.0–100.0)
PLATELETS: 151 10*3/uL (ref 150–400)
RBC: 3.66 MIL/uL — ABNORMAL LOW (ref 3.87–5.11)
RDW: 13 % (ref 11.5–15.5)
WBC: 12.2 10*3/uL — AB (ref 4.0–10.5)

## 2017-12-12 LAB — RPR: RPR: NONREACTIVE

## 2017-12-12 NOTE — Anesthesia Postprocedure Evaluation (Signed)
Anesthesia Post Note  Patient: Andrea GalaSarah Carter  Procedure(s) Performed: AN AD HOC LABOR EPIDURAL     Patient location during evaluation: Mother Baby Anesthesia Type: Epidural Level of consciousness: awake and alert and oriented Pain management: pain level controlled Vital Signs Assessment: post-procedure vital signs reviewed and stable Respiratory status: spontaneous breathing and nonlabored ventilation Cardiovascular status: stable Postop Assessment: no headache, patient able to bend at knees, no backache, no apparent nausea or vomiting, epidural receding and adequate PO intake Anesthetic complications: no    Last Vitals:  Vitals:   12/12/17 0040 12/12/17 0500  BP: (!) 103/57 (!) 104/58  Pulse: 62 80  Resp: 18 18  Temp: 36.9 C 36.7 C  SpO2: 100%     Last Pain:  Vitals:   12/12/17 0500  TempSrc: Oral  PainSc: 0-No pain   Pain Goal:                 Land O'LakesMalinova,Sebastin Perlmutter Hristova

## 2017-12-12 NOTE — Progress Notes (Signed)
Post Partum Day 1 Subjective: no complaints  Objective: Blood pressure (!) 104/58, pulse 80, temperature 98.1 F (36.7 C), temperature source Oral, resp. rate 18, height 5' (1.524 m), weight 150 lb (68 kg), SpO2 100 %, unknown if currently breastfeeding.  Physical Exam:  General: alert and cooperative Lochia: appropriate Uterine Fundus: firm Incision:  DVT Evaluation: No evidence of DVT seen on physical exam.  Recent Labs    12/11/17 1435 12/12/17 0459  HGB 14.1 11.9*  HCT 39.7 34.7*    Assessment/Plan: Plan for discharge tomorrow and Breastfeeding   LOS: 1 day   Lori A Clemmons CNM 12/12/2017, 10:07 AM

## 2017-12-12 NOTE — Lactation Note (Signed)
This note was copied from a baby's chart. Lactation Consultation Note  Patient Name: Andrea Earl GalaSarah Molden ZOXWR'UToday's Date: 12/12/2017 Reason for consult: Follow-up assessment;Term  Baby is 6121 hours old  LC reviewed and updated the doc flow sheets.  Mom called and requested assistance.  Dad changing a wet diaper ( large when LC entered the room)  LC assisted to latch the baby on the right breast / football,  Breast massage , hand express( several drops of colostrum noted )  Baby latched easily with depth and sustained the latch for 12 mins , with  Multiple swallows, increased with breast compressions, and released on his own  And was content.  Baby STS on the moms chest, asleep after feeding.  LC reassured mom to continue with spoon feeding prior to latch or afterwards.     Maternal Data Has patient been taught Hand Expression?: Yes  Feeding Feeding Type: Breast Fed Length of feed: 12 min(multiple swallows,increased compressions )  LATCH Score Latch: Grasps breast easily, tongue down, lips flanged, rhythmical sucking.  Audible Swallowing: Spontaneous and intermittent  Type of Nipple: Everted at rest and after stimulation  Comfort (Breast/Nipple): Soft / non-tender  Hold (Positioning): Assistance needed to correctly position infant at breast and maintain latch.  LATCH Score: 9  Interventions Interventions: Breast feeding basics reviewed;Assisted with latch;Skin to skin;Breast massage;Hand express;Breast compression;Adjust position;Support pillows;Position options  Lactation Tools Discussed/Used WIC Program: No   Consult Status Consult Status: Follow-up Date: 12/13/17 Follow-up type: In-patient    Matilde SprangMargaret Ann Edwin Baines 12/12/2017, 7:12 PM

## 2017-12-12 NOTE — Lactation Note (Signed)
This note was copied from a baby's chart. Lactation Consultation Note  Patient Name: Boy Earl GalaSarah Garland ZOXWR'UToday's Date: 12/12/2017 Reason for consult: Initial assessment;Term  Baby is 6720 hours old LC updated the doc flow sheets per mom.  The baby has had several attempts at the breast latching per mom the 1st was the longest one Of 5-10 mins. Other wise has attempted to latch, and of the baby doesn't latch, spoon fed 3- 7.5 ml.  Voided x1 and stool x 1at 20 hours of life.  As LC was in the room for consult, baby sound asleep laying in bed in front of mom, and not showing signs of hunger.  LC encouraged mom to call on the nurses light  with feeding cues for feeding assessment.  RN and the [employer] secretary aware mom will call.  Mother informed of post-discharge support and given phone number to the lactation department, including services for phone call assistance; out-patient appointments; and breastfeeding support group. List of other breastfeeding resources in the community given in the handout. Encouraged mother to call for problems or concerns related to breastfeeding.     Maternal Data Has patient been taught Hand Expression?: Yes  Feeding Feeding Type: Breast Milk  LATCH Score                   Interventions Interventions: Breast feeding basics reviewed  Lactation Tools Discussed/Used     Consult Status Consult Status: Follow-up Date: 12/12/17 Follow-up type: In-patient    Matilde SprangMargaret Ann Gwenneth Whiteman 12/12/2017, 6:19 PM

## 2017-12-13 MED ORDER — IBUPROFEN 600 MG PO TABS: 600.0000 mg | ORAL_TABLET | Freq: Four times a day (QID) | ORAL | 0 refills | Status: DC

## 2017-12-13 NOTE — Lactation Note (Signed)
This note was copied from a baby's chart. Lactation Consultation Note  Patient Name: Boy Earl GalaSarah Danziger ZOXWR'UToday's Date: 12/13/2017 Reason for consult: Follow-up assessment;Term;Infant weight loss(6% weight loss )  Baby  Is 36 hours  LC reviewed doc flow and updated per mom  4 wets and 3 stools updated in doc flow sheets.  @ consult baby awake. LC reviewed basics of latching, assisted  To latch in the cross cradle position. Baby scored 9 , and latched with depth.  Per mom comfortable with latch. With latch LC noted multiple swallows and breast are filling  And warm .  Mom denies soreness , sore nipple and engorgement prevention and tx reviewed.  LC instructed mom on the use of hand pump, checked flange size and the #24 F is fine for today  And when milk comes has the #27 Flange if needed. Instructed mom on the use shells to enhance the  Compressibility of the areola.  Per mom has  DEBP Medela at home.  Discussed nutritive vs non- nutritive sucking patterns and to watch for hanging out latch.  Mother informed of post-discharge support and given phone number to the lactation department, including services for phone call assistance; out-patient appointments; and breastfeeding support group. List of other breastfeeding resources in the community given in the handout. Encouraged mother to call for problems or concerns related to breastfeeding.   Maternal Data Has patient been taught Hand Expression?: Yes  Feeding Feeding Type: Breast Fed Length of feed: 9 min(multiple swallows, increased with breast compressions )  LATCH Score Latch: Grasps breast easily, tongue down, lips flanged, rhythmical sucking.  Audible Swallowing: Spontaneous and intermittent  Type of Nipple: Everted at rest and after stimulation  Comfort (Breast/Nipple): Filling, red/small blisters or bruises, mild/mod discomfort  Hold (Positioning): Assistance needed to correctly position infant at breast and maintain  latch.  LATCH Score: 8  Interventions Interventions: Breast feeding basics reviewed;Assisted with latch;Skin to skin;Breast massage;Hand express;Breast compression;Adjust position;Support pillows;Position options;Shells;Hand pump  Lactation Tools Discussed/Used Tools: Shells;Pump Shell Type: Inverted Breast pump type: Manual   Consult Status Consult Status: Complete Date: 12/13/17    Kathrin GreathouseMargaret Ann Lekita Kerekes 12/13/2017, 9:50 AM

## 2017-12-13 NOTE — Discharge Summary (Signed)
OB Discharge Summary     Patient Name: Andrea Carter DOB: [birth year] MRN: [id number]  Date of admission: 12/11/2017 Delivering MD: Gerrit Heck   Date of discharge: 12/13/2017  Admitting diagnosis: LABOR Intrauterine pregnancy: [redacted]w[redacted]d     Secondary diagnosis:  Active Problems:   SVD (spontaneous vaginal delivery)   Shoulder dystocia, delivered, current hospitalization   Obstetric labial laceration, delivered, current hospitalization   First degree perineal laceration during delivery  Additional problems: None     Discharge diagnosis: Term Pregnancy Delivered                                                                                                Post partum procedures:None  Augmentation: AROM  Complications: None  Hospital course:  Onset of Labor With Vaginal Delivery     27 y.o. yo G1P1001 at [redacted]w[redacted]d was admitted in Active Labor on 12/11/2017. Patient had an uncomplicated labor course as follows:  Membrane Rupture Time/Date: 5:18 PM ,12/11/2017   Intrapartum Procedures: Episiotomy: None [1]                                         Lacerations:  Labial [10];1st degree [2];Perineal [11]  Patient had a delivery of a Viable infant. 12/11/2017  Information for the patient's newborn:  Jameca, Chumley [[id number]]  Delivery Method: Vaginal, Spontaneous(Filed from Delivery Summary)    Pateint had an uncomplicated postpartum course.  She is ambulating, tolerating a regular diet, passing flatus, and urinating well. Patient is discharged home in stable condition on 12/13/17.   Physical exam  Vitals:   12/12/17 0040 12/12/17 0500 12/12/17 1739 12/13/17 0555  BP: (!) 103/57 (!) 104/58 119/66 116/80  Pulse: 62 80 67 66  Resp: 18 18 20 18   Temp: 98.4 F (36.9 C) 98.1 F (36.7 C) 98.3 F (36.8 C) 97.9 F (36.6 C)  TempSrc: Oral Oral Oral Oral  SpO2: 100%     Weight:      Height:       General: alert, cooperative and no distress  Chest: HRRR, Lungs CTA Abd: Soft, NT   Lochia: appropriate Uterine Fundus: firm at umbilicus Incision: N/A DVT Evaluation: No evidence of DVT seen on physical exam. No significant calf/ankle edema. Labs: Lab Results  Component Value Date   WBC 12.2 (H) 12/12/2017   HGB 11.9 (L) 12/12/2017   HCT 34.7 (L) 12/12/2017   MCV 94.8 12/12/2017   PLT 151 12/12/2017   CMP Latest Ref Rng & Units 04/07/2015  Glucose 70 - 99 mg/dL 782(N)  BUN 6 - 23 mg/dL 8  Creatinine 5.62 - 1.30 mg/dL 8.65  Sodium 784 - 696 mmol/L 147(H)  Potassium 3.5 - 5.1 mmol/L 3.6  Chloride 96 - 112 mmol/L 114(H)  CO2 19 - 32 mmol/L 24  Calcium 8.4 - 10.5 mg/dL 8.8  Total Protein 6.0 - 8.3 g/dL 7.4  Total Bilirubin 0.3 - 1.2 mg/dL 0.5  Alkaline Phos 39 - 117 U/L 71  AST 0 - 37 U/L 29  ALT 0 - 35 U/L 20    Discharge instruction: per After Visit Summary and "Baby and Me Booklet". Pain Management, Peri-Care, Breastfeeding, Who and When to call for postpartum complications. Information Sheet(s) given Postpartum Depression and BB, Contraception Choices   After visit meds:  Allergies as of 12/13/2017      Reactions   Gluten Meal Other (See Comments)   Unknown reaction   No Allergies On File       Medication List    TAKE these medications   ibuprofen 600 MG tablet Commonly known as:  ADVIL,MOTRIN Take 1 tablet (600 mg total) by mouth every 6 (six) hours.   prenatal multivitamin Tabs tablet Take 1 tablet by mouth daily at 12 noon.       Diet: routine diet  Activity: Advance as tolerated. Pelvic rest for 6 weeks.   Outpatient follow up:6 weeks Follow up Appt:No future appointments. Follow up Visit:No Follow-up on file.  Postpartum contraception: Undecided  Newborn Data: Live born female - Jean RosenthalJackson Birth Weight: 8 lb 2.7 oz (3705 g) APGAR: 8, 9  Newborn Delivery   Time head delivered:  12/11/2017 21:36:00 Birth date/time:  12/11/2017 21:37:00 Delivery type:  Vaginal, Spontaneous     Baby Feeding: Breast Disposition:home with  mother   12/13/2017 Cherre RobinsJessica L Azani Brogdon, CNM

## 2017-12-13 NOTE — Discharge Instructions (Signed)
Contraception Choices Contraception, also called birth control, refers to methods or devices that prevent pregnancy. Hormonal methods Contraceptive implant A contraceptive implant is a thin, plastic tube that contains a hormone. It is inserted into the upper part of the arm. It can remain in place for up to 3 years. Progestin-only injections Progestin-only injections are injections of progestin, a synthetic form of the hormone progesterone. They are given every 3 months by a health care provider. Birth control pills Birth control pills are pills that contain hormones that prevent pregnancy. They must be taken once a day, preferably at the same time each day. Birth control patch The birth control patch contains hormones that prevent pregnancy. It is placed on the skin and must be changed once a week for three weeks and removed on the fourth week. A prescription is needed to use this method of contraception. Vaginal ring A vaginal ring contains hormones that prevent pregnancy. It is placed in the vagina for three weeks and removed on the fourth week. After that, the process is repeated with a new ring. A prescription is needed to use this method of contraception. Emergency contraceptive Emergency contraceptives prevent pregnancy after unprotected sex. They come in pill form and can be taken up to 5 days after sex. They work best the sooner they are taken after having sex. Most emergency contraceptives are available without a prescription. This method should not be used as your only form of birth control. Barrier methods Female condom A female condom is a thin sheath that is worn over the penis during sex. Condoms keep sperm from going inside a woman's body. They can be used with a spermicide to increase their effectiveness. They should be disposed after a single use. Female condom A female condom is a soft, loose-fitting sheath that is put into the vagina before sex. The condom keeps sperm from going  inside a woman's body. They should be disposed after a single use. Diaphragm A diaphragm is a soft, dome-shaped barrier. It is inserted into the vagina before sex, along with a spermicide. The diaphragm blocks sperm from entering the uterus, and the spermicide kills sperm. A diaphragm should be left in the vagina for 6-8 hours after sex and removed within 24 hours. A diaphragm is prescribed and fitted by a health care provider. A diaphragm should be replaced every 1-2 years, after giving birth, after gaining more than 15 lb (6.8 kg), and after pelvic surgery. Cervical cap A cervical cap is a round, soft latex or plastic cup that fits over the cervix. It is inserted into the vagina before sex, along with spermicide. It blocks sperm from entering the uterus. The cap should be left in place for 6-8 hours after sex and removed within 48 hours. A cervical cap must be prescribed and fitted by a health care provider. It should be replaced every 2 years. Sponge A sponge is a soft, circular piece of polyurethane foam with spermicide on it. The sponge helps block sperm from entering the uterus, and the spermicide kills sperm. To use it, you make it wet and then insert it into the vagina. It should be inserted before sex, left in for at least 6 hours after sex, and removed and thrown away within 30 hours. Spermicides Spermicides are chemicals that kill or block sperm from entering the cervix and uterus. They can come as a cream, jelly, suppository, foam, or tablet. A spermicide should be inserted into the vagina with an applicator at least 10-27 minutes before  sex to allow time for it to work. The process must be repeated every time you have sex. Spermicides do not require a prescription. Intrauterine contraception Intrauterine device (IUD) An IUD is a T-shaped device that is put in a woman's uterus. There are two types:  Hormone IUD.This type contains progestin, a synthetic form of the hormone progesterone. This  type can stay in place for 3-5 years.  Copper IUD.This type is wrapped in copper wire. It can stay in place for 10 years.  Permanent methods of contraception Female tubal ligation In this method, a woman's fallopian tubes are sealed, tied, or blocked during surgery to prevent eggs from traveling to the uterus. Hysteroscopic sterilization In this method, a small, flexible insert is placed into each fallopian tube. The inserts cause scar tissue to form in the fallopian tubes and block them, so sperm cannot reach an egg. The procedure takes about 3 months to be effective. Another form of birth control must be used during those 3 months. Female sterilization This is a procedure to tie off the tubes that carry sperm (vasectomy). After the procedure, the man can still ejaculate fluid (semen). Natural planning methods Natural family planning In this method, a couple does not have sex on days when the woman could become pregnant. Calendar method This means keeping track of the length of each menstrual cycle, identifying the days when pregnancy can happen, and not having sex on those days. Ovulation method In this method, a couple avoids sex during ovulation. Symptothermal method This method involves not having sex during ovulation. The woman typically checks for ovulation by watching changes in her temperature and in the consistency of cervical mucus. Post-ovulation method In this method, a couple waits to have sex until after ovulation. Summary  Contraception, also called birth control, means methods or devices that prevent pregnancy.  Hormonal methods of contraception include implants, injections, pills, patches, vaginal rings, and emergency contraceptives.  Barrier methods of contraception can include female condoms, female condoms, diaphragms, cervical caps, sponges, and spermicides.  There are two types of IUDs (intrauterine devices). An IUD can be put in a woman's uterus to prevent pregnancy  for 3-5 years.  Permanent sterilization can be done through a procedure for males, females, or both.  Natural family planning methods involve not having sex on days when the woman could become pregnant. This information is not intended to replace advice given to you by your health care provider. Make sure you discuss any questions you have with your health care provider. Document Released: 11/28/2005 Document Revised: 12/31/2016 Document Reviewed: 12/31/2016 Elsevier Interactive Patient Education  2018 [employer] Postpartum Depression and Baby Blues The postpartum period begins right after the birth of a baby. During this time, there is often a great amount of joy and excitement. It is also a time of many changes in the life of the parents. Regardless of how many times a mother gives birth, each child brings new challenges and dynamics to the family. It is not unusual to have feelings of excitement along with confusing shifts in moods, emotions, and thoughts. All mothers are at risk of developing postpartum depression or the "baby blues." These mood changes can occur right after giving birth, or they may occur many months after giving birth. The baby blues or postpartum depression can be mild or severe. Additionally, postpartum depression can go away rather quickly, or it can be a long-term condition. What are the causes? Raised hormone levels and the rapid drop in those levels  are thought to be a main cause of postpartum depression and the baby blues. A number of hormones change during and after pregnancy. Estrogen and progesterone usually decrease right after the delivery of your baby. The levels of thyroid hormone and various cortisol steroids also rapidly drop. Other factors that play a role in these mood changes include major life events and genetics. What increases the risk? If you have any of the following risks for the baby blues or postpartum depression, know what symptoms to watch out for  during the postpartum period. Risk factors that may increase the likelihood of getting the baby blues or postpartum depression include:  Having a personal or family history of depression.  Having depression while being pregnant.  Having premenstrual mood issues or mood issues related to oral contraceptives.  Having a lot of life stress.  Having marital conflict.  Lacking a social support network.  Having a baby with special needs.  Having health problems, such as diabetes.  What are the signs or symptoms? Symptoms of baby blues include:  Brief changes in mood, such as going from extreme happiness to sadness.  Decreased concentration.  Difficulty sleeping.  Crying spells, tearfulness.  Irritability.  Anxiety.  Symptoms of postpartum depression typically begin within the first month after giving birth. These symptoms include:  Difficulty sleeping or excessive sleepiness.  Marked weight loss.  Agitation.  Feelings of worthlessness.  Lack of interest in activity or food.  Postpartum psychosis is a very serious condition and can be dangerous. Fortunately, it is rare. Displaying any of the following symptoms is cause for immediate medical attention. Symptoms of postpartum psychosis include:  Hallucinations and delusions.  Bizarre or disorganized behavior.  Confusion or disorientation.  How is this diagnosed? A diagnosis is made by an evaluation of your symptoms. There are no medical or lab tests that lead to a diagnosis, but there are various questionnaires that a health care provider may use to identify those with the baby blues, postpartum depression, or psychosis. Often, a screening tool called the [place] Postnatal Depression Scale is used to diagnose depression in the postpartum period. How is this treated? The baby blues usually goes away on its own in 1-2 weeks. Social support is often all that is needed. You will be encouraged to get adequate sleep and  rest. Occasionally, you may be given medicines to help you sleep. Postpartum depression requires treatment because it can last several months or longer if it is not treated. Treatment may include individual or group therapy, medicine, or both to address any social, physiological, and psychological factors that may play a role in the depression. Regular exercise, a healthy diet, rest, and social support may also be strongly recommended. Postpartum psychosis is more serious and needs treatment right away. Hospitalization is often needed. Follow these instructions at home:  Get as much rest as you can. Nap when the baby sleeps.  Exercise regularly. Some women find yoga and walking to be beneficial.  Eat a balanced and nourishing diet.  Do little things that you enjoy. Have a cup of tea, take a bubble bath, read your favorite magazine, or listen to your favorite music.  Avoid alcohol.  Ask for help with household chores, cooking, grocery shopping, or running errands as needed. Do not try to do everything.  Talk to people close to you about how you are feeling. Get support from your partner, family members, friends, or other new moms.  Try to stay positive in how you think. Think  about the things you are grateful for.  Do not spend a lot of time alone.  Only take over-the-counter or prescription medicine as directed by your health care provider.  Keep all your postpartum appointments.  Let your health care provider know if you have any concerns. Contact a health care provider if: You are having a reaction to or problems with your medicine. Get help right away if:  You have suicidal feelings.  You think you may harm the baby or someone else. This information is not intended to replace advice given to you by your health care provider. Make sure you discuss any questions you have with your health care provider. Document Released: 09/01/2004 Document Revised: 05/05/2016 Document Reviewed:  09/09/2013 Elsevier Interactive Patient Education  2017 [employer].

## 2019-06-18 ENCOUNTER — Other Ambulatory Visit: Payer: Self-pay

## 2019-06-18 ENCOUNTER — Inpatient Hospital Stay (HOSPITAL_COMMUNITY): Payer: BC Managed Care – PPO

## 2019-06-18 ENCOUNTER — Inpatient Hospital Stay (HOSPITAL_COMMUNITY)
Admission: AD | Admit: 2019-06-18 | Discharge: 2019-06-18 | Disposition: A | Discharge status: Home / Self Care | Payer: BC Managed Care – PPO | Source: Ambulatory Visit | Attending: Obstetrics and Gynecology | Admitting: Obstetrics and Gynecology

## 2019-06-18 ENCOUNTER — Encounter (HOSPITAL_COMMUNITY): Payer: Self-pay | Admitting: *Deleted

## 2019-06-18 DIAGNOSIS — O209 Hemorrhage in early pregnancy, unspecified: Secondary | ICD-10-CM | POA: Diagnosis not present

## 2019-06-18 DIAGNOSIS — Z3A01 Less than 8 weeks gestation of pregnancy: Secondary | ICD-10-CM | POA: Diagnosis not present

## 2019-06-18 DIAGNOSIS — N939 Abnormal uterine and vaginal bleeding, unspecified: Secondary | ICD-10-CM

## 2019-06-18 DIAGNOSIS — O26851 Spotting complicating pregnancy, first trimester: Secondary | ICD-10-CM

## 2019-06-18 DIAGNOSIS — Z3201 Encounter for pregnancy test, result positive: Secondary | ICD-10-CM

## 2019-06-18 LAB — URINALYSIS, ROUTINE W REFLEX MICROSCOPIC
Bilirubin Urine: NEGATIVE
Glucose, UA: NEGATIVE mg/dL
Ketones, ur: NEGATIVE mg/dL
Leukocytes,Ua: NEGATIVE
Nitrite: NEGATIVE
Protein, ur: NEGATIVE mg/dL
Specific Gravity, Urine: 1.011 (ref 1.005–1.030)
pH: 6 (ref 5.0–8.0)

## 2019-06-18 LAB — CBC WITH DIFFERENTIAL/PLATELET
Abs Immature Granulocytes: 0.01 10*3/uL (ref 0.00–0.07)
Basophils Absolute: 0 10*3/uL (ref 0.0–0.1)
Basophils Relative: 0 %
Eosinophils Absolute: 0.1 10*3/uL (ref 0.0–0.5)
Eosinophils Relative: 1 %
HCT: 35.5 % — ABNORMAL LOW (ref 36.0–46.0)
Hemoglobin: 12.5 g/dL (ref 12.0–15.0)
Immature Granulocytes: 0 %
Lymphocytes Relative: 35 %
Lymphs Abs: 2.6 10*3/uL (ref 0.7–4.0)
MCH: 32.1 pg (ref 26.0–34.0)
MCHC: 35.2 g/dL (ref 30.0–36.0)
MCV: 91.3 fL (ref 80.0–100.0)
Monocytes Absolute: 0.6 10*3/uL (ref 0.1–1.0)
Monocytes Relative: 8 %
Neutro Abs: 4 10*3/uL (ref 1.7–7.7)
Neutrophils Relative %: 56 %
Platelets: 179 10*3/uL (ref 150–400)
RBC: 3.89 MIL/uL (ref 3.87–5.11)
RDW: 12.2 % (ref 11.5–15.5)
WBC: 7.3 10*3/uL (ref 4.0–10.5)
nRBC: 0 % (ref 0.0–0.2)

## 2019-06-18 LAB — HCG, QUANTITATIVE, PREGNANCY: hCG, Beta Chain, Quant, S: 43425 m[IU]/mL — ABNORMAL HIGH (ref <5)

## 2019-06-18 LAB — POCT PREGNANCY, URINE: Preg Test, Ur: POSITIVE — AB

## 2019-06-18 NOTE — MAU Provider Note (Signed)
History     CSN: [id number]  Arrival date and time: 06/18/19 [id number]   First Provider Initiated Contact with Patient 06/18/19 2033      Chief Complaint  Patient presents with  . Vaginal Bleeding   HPI Andrea Carter is a 28 y.o. G2P1001 at about [redacted] weeks GA by certain LMP who presents to MAU with chief complaint of vaginal spotting. This is a new problem, onset July 1 or 2. She experienced light spotting throughout the day on those dates which then resolved without intervention. She experienced a recurrence of spotting today. She denies abdominal pain. She also denies heavy vaginal bleeding, abdominal tenderness, dysuria, weakness, syncope, fever. She is remote from sexual intercourse.  OB History    Gravida  2   Para  1   Term  1   Preterm      AB      Living  1     SAB      TAB      Ectopic      Multiple  0   Live Births  1           No past medical history on file.  No past surgical history on file.  No family history on file.  Social History   Tobacco Use  . Smoking status: Never Smoker  . Smokeless tobacco: Never Used  Substance Use Topics  . Alcohol use: Yes  . Drug use: Yes    Allergies:  Allergies  Allergen Reactions  . Gluten Meal Other (See Comments)    Unknown reaction  . No Allergies On File     Medications Prior to Admission  Medication Sig Dispense Refill Last Dose  . ibuprofen (ADVIL,MOTRIN) 600 MG tablet Take 1 tablet (600 mg total) by mouth every 6 (six) hours. 30 tablet 0   . Prenatal Vit-Fe Fumarate-FA (PRENATAL MULTIVITAMIN) TABS tablet Take 1 tablet by mouth daily at 12 noon.       Review of Systems  Constitutional: Negative for chills, fatigue and fever.  Respiratory: Negative for shortness of breath.   Gastrointestinal: Negative for abdominal pain, nausea and vomiting.  Genitourinary: Positive for vaginal bleeding. Negative for difficulty urinating, dysuria, flank pain, vaginal discharge and vaginal pain.   Musculoskeletal: Negative for back pain.  All other systems reviewed and are negative.  Physical Exam   Blood pressure 120/65, pulse 70, temperature 98.7 F (37.1 C), resp. rate 20, height 5' (1.524 m), weight 53.9 kg, last menstrual period 05/07/2019, unknown if currently breastfeeding.  Physical Exam  Nursing note and vitals reviewed. Constitutional: She is oriented to person, place, and time. She appears well-developed and well-nourished.  Cardiovascular: Normal rate.  Respiratory: Effort normal. No respiratory distress.  GI: Soft. She exhibits no distension. There is no abdominal tenderness. There is no rebound and no guarding.  Genitourinary:    No vaginal discharge.     Genitourinary Comments: No evidence of bleeding/spotting during evaluation in MAU   Musculoskeletal: Normal range of motion.  Neurological: She is alert and oriented to person, place, and time.  Skin: Skin is warm and dry.  Psychiatric: She has a normal mood and affect. Her behavior is normal. Judgment and thought content normal.    MAU Course/MDM  Procedures  Patient Vitals for the past 24 hrs:  BP Temp Pulse Resp Height Weight  06/18/19 2139 112/71 - 78 16 - -  06/18/19 1943 120/65 98.7 F (37.1 C) 70 20 5' (1.524 m) 53.9 kg  Results for orders placed or performed during the hospital encounter of 06/18/19 (from the past 24 hour(s))  Urinalysis, Routine w reflex microscopic     Status: Abnormal   Collection Time: 06/18/19  7:49 PM  Result Value Ref Range   Color, Urine YELLOW YELLOW   APPearance CLEAR CLEAR   Specific Gravity, Urine 1.011 1.005 - 1.030   pH 6.0 5.0 - 8.0   Glucose, UA NEGATIVE NEGATIVE mg/dL   Hgb urine dipstick SMALL (A) NEGATIVE   Bilirubin Urine NEGATIVE NEGATIVE   Ketones, ur NEGATIVE NEGATIVE mg/dL   Protein, ur NEGATIVE NEGATIVE mg/dL   Nitrite NEGATIVE NEGATIVE   Leukocytes,Ua NEGATIVE NEGATIVE   RBC / HPF 0-5 0 - 5 RBC/hpf   WBC, UA 0-5 0 - 5 WBC/hpf   Bacteria, UA  RARE (A) NONE SEEN   Squamous Epithelial / LPF 0-5 0 - 5  Pregnancy, urine POC     Status: Abnormal   Collection Time: 06/18/19  8:03 PM  Result Value Ref Range   Preg Test, Ur POSITIVE (A) NEGATIVE  CBC with Differential/Platelet     Status: Abnormal   Collection Time: 06/18/19  8:49 PM  Result Value Ref Range   WBC 7.3 4.0 - 10.5 K/uL   RBC 3.89 3.87 - 5.11 MIL/uL   Hemoglobin 12.5 12.0 - 15.0 g/dL   HCT 35.5 (L) 36.0 - 46.0 %   MCV 91.3 80.0 - 100.0 fL   MCH 32.1 26.0 - 34.0 pg   MCHC 35.2 30.0 - 36.0 g/dL   RDW 12.2 11.5 - 15.5 %   Platelets 179 150 - 400 K/uL   nRBC 0.0 0.0 - 0.2 %   Neutrophils Relative % 56 %   Neutro Abs 4.0 1.7 - 7.7 K/uL   Lymphocytes Relative 35 %   Lymphs Abs 2.6 0.7 - 4.0 K/uL   Monocytes Relative 8 %   Monocytes Absolute 0.6 0.1 - 1.0 K/uL   Eosinophils Relative 1 %   Eosinophils Absolute 0.1 0.0 - 0.5 K/uL   Basophils Relative 0 %   Basophils Absolute 0.0 0.0 - 0.1 K/uL   Immature Granulocytes 0 %   Abs Immature Granulocytes 0.01 0.00 - 0.07 K/uL  hCG, quantitative, pregnancy     Status: Abnormal   Collection Time: 06/18/19  8:49 PM  Result Value Ref Range   hCG, Beta Chain, Quant, S 43,425 (H) <5 mIU/mL   US Ob Less Than 14 Weeks With Ob Transvaginal  Result Date: 06/18/2019 CLINICAL DATA:  Initial evaluation for intermittent vaginal bleeding, early pregnancy. EXAM: OBSTETRIC <14 WK [place] AND TRANSVAGINAL OB US TECHNIQUE: Both transabdominal and transvaginal ultrasound examinations were performed for complete evaluation of the gestation as well as the maternal uterus, adnexal regions, and pelvic cul-de-sac. Transvaginal technique was performed to assess early pregnancy. COMPARISON:  None. FINDINGS: Intrauterine gestational sac: Single Yolk sac:  Negative. Embryo:  Negative. Cardiac Activity: Negative. Heart Rate: N/A  bpm MSD: 18.7 mm   6 w   5 d Subchorionic hemorrhage:  None visualized. Maternal uterus/adnexae: Ovaries are normal in appearance  bilaterally. Small corpus luteal cyst noted on the right. No free fluid within the pelvis. IMPRESSION: 1. Single intrauterine gestational sac, with mean sac diameter measuring 18.7 mm, but no internal yolk sac or fetal pole yet visualized. Findings are suspicious but not yet definitive for failed pregnancy. Recommend follow-up US in 10-14 days for definitive diagnosis. This recommendation follows SRU consensus guidelines: Diagnostic Criteria for Nonviable Pregnancy Early in the  First Trimester. Malva Limes Engl J Med 2013; 454:0981-19; 369:1443-51. 2. No other acute maternal uterine or adnexal abnormality identified. Electronically Signed   By: Rise MuBenjamin  McClintock M.D.   On: 06/18/2019 21:20   Assessment and Plan  --28 y.o. G2P1001 at 6 weeks by LMP --US findings suspicious for failed pregnancy --Blood type O POS --Discharge home in stable condition with bleeding precautions  F/U:  Repeat Quant hCG in 48 hours, repeat imaging in 10-14 days Plan for follow up discussed with Dale DurhamJade Montana, [facility] CNM at [address] CantorSamantha C , CNM 06/18/2019, 10:08 PM

## 2019-06-18 NOTE — Discharge Instructions (Signed)
Human Chorionic Gonadotropin Test °Why am I having this test? °A human chorionic gonadotropin (hCG) test is done to determine whether you are pregnant. It can also be used: °· To diagnose an abnormal pregnancy. °· To determine whether you have had a failed pregnancy (miscarriage) or are at risk of one. °What is being tested? °This test checks the level of the human chorionic gonadotropin (hCG) hormone in the blood. This hormone is produced during pregnancy by the cells that form the placenta. The placenta is the organ that grows inside your womb (uterus) to nourish a developing baby. When you are pregnant, hCG can be detected in your blood or urine 7 to 8 days before your missed period. It continues to go up for the first 8-10 weeks of pregnancy. °The presence of hCG in your blood can be measured with several different types of tests. You may have: °· A urine test. °? Because this hormone is eliminated from your body by your kidneys, you may have a urine test to find out whether you are pregnant. A home pregnancy test detects whether there is hCG in your urine. °? A urine test only shows whether there is hCG in your urine. It does not measure how much. °· A qualitative blood test. °? You may have this type of blood test to find out if you are pregnant. °? This blood test only shows whether there is hCG in your blood. It does not measure how much. °· A quantitative blood test. °? This type of blood test measures the amount of hCG in your blood. °? You may have this test to: °§ Diagnose an abnormal pregnancy. °§ Check whether you have had a miscarriage. °§ Determine whether you are at risk of a miscarriage. °What kind of sample is taken? ° °  ° °Two kinds of samples may be collected to test for the hCG hormone. °· Blood. It is usually collected by inserting a needle into a blood vessel. °· Urine. It is usually collected by urinating into a germ-free (sterile) specimen cup. It is best to collect the sample the first  time you urinate in the morning. °How do I prepare for this test? °No preparation is needed for a blood test.  °For the urine test: °· Let your health care provider know about: °? All medicines you are taking, including vitamins, herbs, creams, and over-the-counter medicines. °? Any blood in your urine. This may interfere with the result. °· Do not drink too much fluid. Drink as you normally would, or as directed by your health care provider. °How are the results reported? °Depending on the type of test that you have, your test results may be reported as values. Your health care provider will compare your results to normal ranges that were established after testing a large group of people (reference ranges). Reference ranges may vary among labs and hospitals. For this test, common reference ranges that show absence of pregnancy are: °· Quantitative hCG blood levels: less than 5 IU/L. °Other results will be reported as either positive or negative. For this test, normal results (meaning the absence of pregnancy) are: °· Negative for hCG in the urine test. °· Negative for hCG in the qualitative blood test. °What do the results mean? °Urine and qualitative blood test °· A negative result could mean: °? That you are not pregnant. °? That the test was done too early in your pregnancy to detect hCG in your blood or urine. If you still have other signs   of pregnancy, the test will be repeated. °· A positive result means: °? That you are most likely pregnant. Your health care provider may confirm your pregnancy with an imaging study (ultrasound) of your uterus, if needed. °Quantitative blood test °Results of the quantitative hCG blood test will be interpreted as follows: °· Less than 5 IU/L: You are most likely not pregnant. °· Greater than 25 IU/L: You are most likely pregnant. °· hCG levels that are higher than expected: °? You are pregnant with twins. °? You have abnormal growths in the uterus. °· hCG levels that are  rising more slowly than expected: °? You have an ectopic pregnancy (also called a tubal pregnancy). °· hCG levels that are falling: °? You may be having a miscarriage. °Talk with your health care provider about what your results mean. °Questions to ask your health care provider °Ask your health care provider, or the department that is doing the test: °· When will my results be ready? °· How will I get my results? °· What are my treatment options? °· What other tests do I need? °· What are my next steps? °Summary °· A human chorionic gonadotropin test is done to determine whether you are pregnant. °· When you are pregnant, hCG can be detected in your blood or urine 7 to 8 days before your missed period. It continues to go up for the first 8-10 weeks of pregnancy. °· Your hCG level can be measured with different types of tests. You may have a urine test, a qualitative blood test, or a quantitative blood test. °· Talk with your health care provider about what your results mean. °This information is not intended to replace advice given to you by your health care provider. Make sure you discuss any questions you have with your health care provider. °Document Released: 12/30/2004 Document Revised: 10/30/2017 Document Reviewed: 10/30/2017 °Elsevier Patient Education © 2020 [employer] ° °

## 2019-06-18 NOTE — MAU Note (Signed)
PT SAYS  SHE HAD HPT - POSITIVE - ON 6-28-  THEN HAD SPOTTING - BROWN - WHEN SHE WIPED.  SHE CALLED DR - BUT CLOSED- NO SPOTTING ON WEEKEND- BUT DID TODAY.   NO PAIN.   CALLED OFFICE  TODAY-  TOLD TO COME HERE.

## 2019-06-20 DIAGNOSIS — O3680X0 Pregnancy with inconclusive fetal viability, not applicable or unspecified: Secondary | ICD-10-CM | POA: Diagnosis not present

## 2019-06-26 DIAGNOSIS — Z20828 Contact with and (suspected) exposure to other viral communicable diseases: Secondary | ICD-10-CM | POA: Diagnosis not present

## 2019-07-04 DIAGNOSIS — Z3A01 Less than 8 weeks gestation of pregnancy: Secondary | ICD-10-CM | POA: Diagnosis not present

## 2019-07-04 DIAGNOSIS — O3680X9 Pregnancy with inconclusive fetal viability, other fetus: Secondary | ICD-10-CM | POA: Diagnosis not present

## 2019-07-16 ENCOUNTER — Encounter (HOSPITAL_COMMUNITY): Payer: Self-pay | Admitting: Emergency Medicine

## 2019-07-16 ENCOUNTER — Ambulatory Visit (HOSPITAL_COMMUNITY)
Admission: EM | Admit: 2019-07-16 | Discharge: 2019-07-16 | Disposition: A | Discharge status: Home / Self Care | Payer: BC Managed Care – PPO | Attending: Internal Medicine | Admitting: Internal Medicine

## 2019-07-16 DIAGNOSIS — Z3A10 10 weeks gestation of pregnancy: Secondary | ICD-10-CM

## 2019-07-16 DIAGNOSIS — Z20822 Contact with and (suspected) exposure to covid-19: Secondary | ICD-10-CM

## 2019-07-16 DIAGNOSIS — Z20828 Contact with and (suspected) exposure to other viral communicable diseases: Secondary | ICD-10-CM | POA: Insufficient documentation

## 2019-07-16 NOTE — Discharge Instructions (Signed)
Person Under Monitoring Name: Andrea Carter  Location: [place] [address]   Infection Prevention Recommendations for Individuals Confirmed to have, or Being Evaluated for, 2019 Novel Coronavirus (COVID-19) Infection Who Receive Care at Home  Individuals who are confirmed to have, or are being evaluated for, COVID-19 should follow the prevention steps below until a healthcare provider or local or state health department says they can return to normal activities.  Stay home except to get medical care You should restrict activities outside your home, except for getting medical care. Do not go to work, school, or public areas, and do not use public transportation or taxis.  Call ahead before visiting your doctor Before your medical appointment, call the healthcare provider and tell them that you have, or are being evaluated for, COVID-19 infection. This will help the healthcare providers office take steps to keep other people from getting infected. Ask your healthcare provider to call the local or state health department.  Monitor your symptoms Seek prompt medical attention if your illness is worsening (e.g., difficulty breathing). Before going to your medical appointment, call the healthcare provider and tell them that you have, or are being evaluated for, COVID-19 infection. Ask your healthcare provider to call the local or state health department.  Wear a facemask You should wear a facemask that covers your nose and mouth when you are in the same room with other people and when you visit a healthcare provider. People who live with or visit you should also wear a facemask while they are in the same room with you.  Separate yourself from other people in your home As much as possible, you should stay in a different room from other people in your home. Also, you should use a separate bathroom, if available.  Avoid sharing household items You should not share  dishes, drinking glasses, cups, eating utensils, towels, bedding, or other items with other people in your home. After using these items, you should wash them thoroughly with soap and water.  Cover your coughs and sneezes Cover your mouth and nose with a tissue when you cough or sneeze, or you can cough or sneeze into your sleeve. Throw used tissues in a lined trash can, and immediately wash your hands with soap and water for at least 20 seconds or use an alcohol-based hand rub.  Wash your [employer] your hands often and thoroughly with soap and water for at least 20 seconds. You can use an alcohol-based hand sanitizer if soap and water are not available and if your hands are not visibly dirty. Avoid touching your eyes, nose, and mouth with unwashed hands.   Prevention Steps for Caregivers and Household Members of Individuals Confirmed to have, or Being Evaluated for, COVID-19 Infection Being Cared for in the Home  If you live with, or provide care at home for, a person confirmed to have, or being evaluated for, COVID-19 infection please follow these guidelines to prevent infection:  Follow healthcare providers instructions Make sure that you understand and can help the patient follow any healthcare provider instructions for all care.  Provide for the patients basic needs You should help the patient with basic needs in the home and provide support for getting groceries, prescriptions, and other personal needs.  Monitor the patients symptoms If they are getting sicker, call his or her medical provider and tell them that the patient has, or is being evaluated for, COVID-19 infection. This will help the healthcare providers office take  steps to keep other people from getting infected. Ask the healthcare provider to call the local or state health department.  Limit the number of people who have contact with the patient If possible, have only one caregiver for the patient. Other  household members should stay in another home or place of residence. If this is not possible, they should stay in another room, or be separated from the patient as much as possible. Use a separate bathroom, if available. Restrict visitors who do not have an essential need to be in the home.  Keep older adults, very young children, and other sick people away from the patient Keep older adults, very young children, and those who have compromised immune systems or chronic health conditions away from the patient. This includes people with chronic heart, lung, or kidney conditions, diabetes, and cancer.  Ensure good ventilation Make sure that shared spaces in the home have good air flow, such as from an air conditioner or an opened window, weather permitting.  Wash your hands often Wash your hands often and thoroughly with soap and water for at least 20 seconds. You can use an alcohol based hand sanitizer if soap and water are not available and if your hands are not visibly dirty. Avoid touching your eyes, nose, and mouth with unwashed hands. Use disposable paper towels to dry your hands. If not available, use dedicated cloth towels and replace them when they become wet.  Wear a facemask and gloves Wear a disposable facemask at all times in the room and gloves when you touch or have contact with the patients blood, body fluids, and/or secretions or excretions, such as sweat, saliva, sputum, nasal mucus, vomit, urine, or feces.  Ensure the mask fits over your nose and mouth tightly, and do not touch it during use. Throw out disposable facemasks and gloves after using them. Do not reuse. Wash your hands immediately after removing your facemask and gloves. If your personal clothing becomes contaminated, carefully remove clothing and launder. Wash your hands after handling contaminated clothing. Place all used disposable facemasks, gloves, and other waste in a lined container before disposing them with  other household waste. Remove gloves and wash your hands immediately after handling these items.  Do not share dishes, glasses, or other household items with the patient Avoid sharing household items. You should not share dishes, drinking glasses, cups, eating utensils, towels, bedding, or other items with a patient who is confirmed to have, or being evaluated for, COVID-19 infection. After the person uses these items, you should wash them thoroughly with soap and water.  Wash laundry thoroughly Immediately remove and wash clothes or bedding that have blood, body fluids, and/or secretions or excretions, such as sweat, saliva, sputum, nasal mucus, vomit, urine, or feces, on them. Wear gloves when handling laundry from the patient. Read and follow directions on labels of laundry or clothing items and detergent. In general, wash and dry with the warmest temperatures recommended on the label.  Clean all areas the individual has used often Clean all touchable surfaces, such as counters, tabletops, doorknobs, bathroom fixtures, toilets, phones, keyboards, tablets, and bedside tables, every day. Also, clean any surfaces that may have blood, body fluids, and/or secretions or excretions on them. Wear gloves when cleaning surfaces the patient has come in contact with. Use a diluted bleach solution (e.g., dilute bleach with 1 part bleach and 10 parts water) or a household disinfectant with a label that says EPA-registered for coronaviruses. To make a bleach solution  at home, add 1 tablespoon of bleach to 1 quart (4 cups) of water. For a larger supply, add  cup of bleach to 1 gallon (16 cups) of water. Read labels of cleaning products and follow recommendations provided on product labels. Labels contain instructions for safe and effective use of the cleaning product including precautions you should take when applying the product, such as wearing gloves or eye protection and making sure you have good ventilation  during use of the product. Remove gloves and wash hands immediately after cleaning.  Monitor yourself for signs and symptoms of illness Caregivers and household members are considered close contacts, should monitor their health, and will be asked to limit movement outside of the home to the extent possible. Follow the monitoring steps for close contacts listed on the symptom monitoring form.   ? If you have additional questions, contact your local health department or call the epidemiologist on call at [phone] (available 24/7). ? This guidance is subject to change. For the most up-to-date guidance from [facility], please refer to their website: [email or web address]

## 2019-07-16 NOTE — ED Triage Notes (Signed)
Pt states her son has covid symptoms and another child tested positive for covid at daycare. Denies symptoms herself.

## 2019-07-16 NOTE — ED Provider Notes (Signed)
[facility]    CSN: [id number] Arrival date & time: 07/16/19  1722      History   Chief Complaint No chief complaint on file. COVID testing HPI Andrea Carter Koker is a 28 y.o. female approximately [redacted] weeks pregnant presenting today for evaluation of COVID testing.  Patient states that her son had exposure to COVID at his daycare and has subsequently developed URI symptoms.  She has not developed any symptoms and is currently asymptomatic.  Denies fevers chills or body aches.  Denies fatigue, nausea or vomiting.  Denies cough, congestion or sore throat.  No other known exposures.  Currently working from home.  HPI  Past Medical History:  Diagnosis Date  . Abnormal glucose tolerance test 10/02/2017  . First degree perineal laceration during delivery 12/11/2017  . Obstetric labial laceration, delivered, current hospitalization 12/11/2017   Bilaterally-Repaired  . Pregnant 04/26/2017  . Shoulder dystocia, delivered, current hospitalization 12/11/2017  . SVD (spontaneous vaginal delivery) 12/11/2017    Patient Active Problem List   Diagnosis Date Noted  . Positive pregnancy test 06/18/2019    History reviewed. No pertinent surgical history.  OB History    Gravida  2   Para  1   Term  1   Preterm      AB      Living  1     SAB      TAB      Ectopic      Multiple  0   Live Births  1            Home Medications    Prior to Admission medications   Medication Sig Start Date End Date Taking? Authorizing Provider  Prenatal Vit-Fe Fumarate-FA (PRENATAL MULTIVITAMIN) TABS tablet Take 1 tablet by mouth daily at 12 noon.    [provider]    Family History No family history on file.  Social History Social History   Tobacco Use  . Smoking status: Never Smoker  . Smokeless tobacco: Never Used  Substance Use Topics  . Alcohol use: Yes  . Drug use: Yes     Allergies   Gluten meal and No allergies on file   Review of Systems Review  of Systems  Constitutional: Negative for activity change, appetite change, chills, fatigue and fever.  HENT: Negative for congestion, ear pain, rhinorrhea, sinus pressure, sore throat and trouble swallowing.   Eyes: Negative for discharge and redness.  Respiratory: Negative for cough, chest tightness and shortness of breath.   Cardiovascular: Negative for chest pain.  Gastrointestinal: Negative for abdominal pain, diarrhea, nausea and vomiting.  Musculoskeletal: Negative for myalgias.  Skin: Negative for rash.  Neurological: Negative for dizziness, light-headedness and headaches.     Physical Exam Triage Vital Signs ED Triage Vitals  Enc Vitals Group     BP 07/16/19 1756 (!) 104/56     Pulse Rate 07/16/19 1756 67     Resp 07/16/19 1756 14     Temp 07/16/19 1756 98.7 F (37.1 C)     Temp src --      SpO2 07/16/19 1756 98 %     Weight --      Height --      Head Circumference --      Peak Flow --      Pain Score 07/16/19 1757 0     Pain Loc --      Pain Edu? --      Excl. in GC? --  No data found.  Updated Vital Signs BP (!) 104/56   Pulse 67   Temp 98.7 F (37.1 C)   Resp 14   LMP 05/07/2019 (LMP Unknown)   SpO2 98%   Visual Acuity Right Eye Distance:   Left Eye Distance:   Bilateral Distance:    Right Eye Near:   Left Eye Near:    Bilateral Near:     Physical Exam Vitals signs and nursing note reviewed.  Constitutional:      General: She is not in acute distress.    Appearance: She is well-developed.     Comments: Well-appearing  HENT:     Head: Normocephalic and atraumatic.  Eyes:     Conjunctiva/sclera: Conjunctivae normal.  Neck:     Musculoskeletal: Neck supple.  Cardiovascular:     Rate and Rhythm: Normal rate and regular rhythm.     Heart sounds: No murmur.  Pulmonary:     Effort: Pulmonary effort is normal. No respiratory distress.     Breath sounds: Normal breath sounds.     Comments: Breathing comfortably at rest, CTABL, no wheezing,  rales or other adventitious sounds auscultated Abdominal:     Palpations: Abdomen is soft.     Tenderness: There is no abdominal tenderness.  Skin:    General: Skin is warm and dry.  Neurological:     Mental Status: She is alert.      UC Treatments / Results  Labs (all labs ordered are listed, but only abnormal results are displayed) Labs Reviewed  NOVEL CORONAVIRUS, NAA (HOSPITAL ORDER, SEND-OUT TO REF LAB)    EKG   Radiology No results found.  Procedures Procedures (including critical care time)  Medications Ordered in UC Medications - No data to display  Initial Impression / Assessment and Plan / UC Course  I have reviewed the triage vital signs and the nursing notes.  Pertinent labs & imaging results that were available during my care of the patient were reviewed by me and considered in my medical decision making (see chart for details).     Currently asymptomatic, vital signs stable.  COVID swab obtained.  Will have him remain home until results return.Discussed strict return precautions. Patient verbalized understanding and is agreeable with plan.  Final Clinical Impressions(s) / UC Diagnoses   Final diagnoses:  Exposure to Covid-19 Virus     Discharge Instructions        Person Under Monitoring Name: Andrea Carter Swails  [place] [address] [place] [address]   Infection Prevention Recommendations for Individuals Confirmed to have, or Being Evaluated for, 2019 Novel Coronavirus (COVID-19) Infection Who Receive Care at Home  Individuals who are confirmed to have, or are being evaluated for, COVID-19 should follow the prevention steps below until a healthcare provider or local or state health department says they can return to normal activities.  Stay home except to get medical care You should restrict activities outside your home, except for getting medical care. Do not go to work, school, or public areas, and do not use public transportation  or taxis.  Call ahead before visiting your doctor Before your medical appointment, call the healthcare provider and tell them that you have, or are being evaluated for, COVID-19 infection. This will help the healthcare provider's office take steps to keep other people from getting infected. Ask your healthcare provider to call the local or state health department.  Monitor your symptoms Seek prompt medical attention if your illness is worsening (e.g., difficulty breathing). Before  going to your medical appointment, call the healthcare provider and tell them that you have, or are being evaluated for, COVID-19 infection. Ask your healthcare provider to call the local or state health department.  Wear a facemask You should wear a facemask that covers your nose and mouth when you are in the same room with other people and when you visit a healthcare provider. People who live with or visit you should also wear a facemask while they are in the same room with you.  Separate yourself from other people in your home As much as possible, you should stay in a different room from other people in your home. Also, you should use a separate bathroom, if available.  Avoid sharing household items You should not share dishes, drinking glasses, cups, eating utensils, towels, bedding, or other items with other people in your home. After using these items, you should wash them thoroughly with soap and water.  Cover your coughs and sneezes Cover your mouth and nose with a tissue when you cough or sneeze, or you can cough or sneeze into your sleeve. Throw used tissues in a lined trash can, and immediately wash your hands with soap and water for at least 20 seconds or use an alcohol-based hand rub.  Wash your [employer] your hands often and thoroughly with soap and water for at least 20 seconds. You can use an alcohol-based hand sanitizer if soap and water are not available and if your hands are not visibly  dirty. Avoid touching your eyes, nose, and mouth with unwashed hands.   Prevention Steps for Caregivers and Household Members of Individuals Confirmed to have, or Being Evaluated for, COVID-19 Infection Being Cared for in the Home  If you live with, or provide care at home for, a person confirmed to have, or being evaluated for, COVID-19 infection please follow these guidelines to prevent infection:  Follow healthcare provider's instructions Make sure that you understand and can help the patient follow any healthcare provider instructions for all care.  Provide for the patient's basic needs You should help the patient with basic needs in the home and provide support for getting groceries, prescriptions, and other personal needs.  Monitor the patient's symptoms If they are getting sicker, call his or her medical provider and tell them that the patient has, or is being evaluated for, COVID-19 infection. This will help the healthcare provider's office take steps to keep other people from getting infected. Ask the healthcare provider to call the local or state health department.  Limit the number of people who have contact with the patient  If possible, have only one caregiver for the patient.  Other household members should stay in another home or place of residence. If this is not possible, they should stay  in another room, or be separated from the patient as much as possible. Use a separate bathroom, if available.  Restrict visitors who do not have an essential need to be in the home.  Keep older adults, very young children, and other sick people away from the patient Keep older adults, very young children, and those who have compromised immune systems or chronic health conditions away from the patient. This includes people with chronic heart, lung, or kidney conditions, diabetes, and cancer.  Ensure good ventilation Make sure that shared spaces in the home have good air flow,  such as from an air conditioner or an opened window, weather permitting.  Wash your hands often  Wash your hands often  and thoroughly with soap and water for at least 20 seconds. You can use an alcohol based hand sanitizer if soap and water are not available and if your hands are not visibly dirty.  Avoid touching your eyes, nose, and mouth with unwashed hands.  Use disposable paper towels to dry your hands. If not available, use dedicated cloth towels and replace them when they become wet.  Wear a facemask and gloves  Wear a disposable facemask at all times in the room and gloves when you touch or have contact with the patient's blood, body fluids, and/or secretions or excretions, such as sweat, saliva, sputum, nasal mucus, vomit, urine, or feces.  Ensure the mask fits over your nose and mouth tightly, and do not touch it during use.  Throw out disposable facemasks and gloves after using them. Do not reuse.  Wash your hands immediately after removing your facemask and gloves.  If your personal clothing becomes contaminated, carefully remove clothing and launder. Wash your hands after handling contaminated clothing.  Place all used disposable facemasks, gloves, and other waste in a lined container before disposing them with other household waste.  Remove gloves and wash your hands immediately after handling these items.  Do not share dishes, glasses, or other household items with the patient  Avoid sharing household items. You should not share dishes, drinking glasses, cups, eating utensils, towels, bedding, or other items with a patient who is confirmed to have, or being evaluated for, COVID-19 infection.  After the person uses these items, you should wash them thoroughly with soap and water.  Wash laundry thoroughly  Immediately remove and wash clothes or bedding that have blood, body fluids, and/or secretions or excretions, such as sweat, saliva, sputum, nasal mucus, vomit, urine,  or feces, on them.  Wear gloves when handling laundry from the patient.  Read and follow directions on labels of laundry or clothing items and detergent. In general, wash and dry with the warmest temperatures recommended on the label.  Clean all areas the individual has used often  Clean all touchable surfaces, such as counters, tabletops, doorknobs, bathroom fixtures, toilets, phones, keyboards, tablets, and bedside tables, every day. Also, clean any surfaces that may have blood, body fluids, and/or secretions or excretions on them.  Wear gloves when cleaning surfaces the patient has come in contact with.  Use a diluted bleach solution (e.g., dilute bleach with 1 part bleach and 10 parts water) or a household disinfectant with a label that says EPA-registered for coronaviruses. To make a bleach solution at home, add 1 tablespoon of bleach to 1 quart (4 cups) of water. For a larger supply, add  cup of bleach to 1 gallon (16 cups) of water.  Read labels of cleaning products and follow recommendations provided on product labels. Labels contain instructions for safe and effective use of the cleaning product including precautions you should take when applying the product, such as wearing gloves or eye protection and making sure you have good ventilation during use of the product.  Remove gloves and wash hands immediately after cleaning.  Monitor yourself for signs and symptoms of illness Caregivers and household members are considered close contacts, should monitor their health, and will be asked to limit movement outside of the home to the extent possible. Follow the monitoring steps for close contacts listed on the symptom monitoring form.   ? If you have additional questions, contact your local health department or call the epidemiologist on call at [phone] (available 24/7). ?  This guidance is subject to change. For the most up-to-date guidance from [facility], please refer to their website:  TripMetro.huhttps://www.cdc.gov/coronavirus/2019-ncov/hcp/guidance-prevent-spread.html   ED Prescriptions    None     Controlled Substance Prescriptions [place] Controlled Substance Registry consulted? Not Applicable   Lew DawesWieters, Lovada Barwick C, [place]-C 07/16/19 2017

## 2019-07-18 DIAGNOSIS — Z3A10 10 weeks gestation of pregnancy: Secondary | ICD-10-CM | POA: Diagnosis not present

## 2019-07-18 DIAGNOSIS — N925 Other specified irregular menstruation: Secondary | ICD-10-CM | POA: Diagnosis not present

## 2019-07-18 DIAGNOSIS — O3680X9 Pregnancy with inconclusive fetal viability, other fetus: Secondary | ICD-10-CM | POA: Diagnosis not present

## 2019-07-18 DIAGNOSIS — Z3481 Encounter for supervision of other normal pregnancy, first trimester: Secondary | ICD-10-CM | POA: Diagnosis not present

## 2019-07-18 DIAGNOSIS — Z369 Encounter for antenatal screening, unspecified: Secondary | ICD-10-CM | POA: Diagnosis not present

## 2019-07-18 DIAGNOSIS — Z124 Encounter for screening for malignant neoplasm of cervix: Secondary | ICD-10-CM | POA: Diagnosis not present

## 2019-07-18 LAB — NOVEL CORONAVIRUS, NAA (HOSP ORDER, SEND-OUT TO REF LAB; TAT 18-24 HRS): SARS-CoV-2, NAA: NOT DETECTED

## 2019-07-18 LAB — OB RESULTS CONSOLE GC/CHLAMYDIA
Chlamydia: NEGATIVE
Gonorrhea: NEGATIVE

## 2019-07-18 LAB — OB RESULTS CONSOLE RUBELLA ANTIBODY, IGM: Rubella: IMMUNE

## 2019-07-18 LAB — OB RESULTS CONSOLE RPR: RPR: NONREACTIVE

## 2019-07-18 LAB — OB RESULTS CONSOLE HIV ANTIBODY (ROUTINE TESTING): HIV: NONREACTIVE

## 2019-07-18 LAB — OB RESULTS CONSOLE HEPATITIS B SURFACE ANTIGEN: Hepatitis B Surface Ag: NEGATIVE

## 2019-07-18 LAB — OB RESULTS CONSOLE ABO/RH: "RH Type ": POSITIVE

## 2019-07-18 LAB — OB RESULTS CONSOLE ANTIBODY SCREEN: Antibody Screen: NEGATIVE

## 2019-07-19 ENCOUNTER — Encounter (HOSPITAL_COMMUNITY): Payer: Self-pay

## 2019-08-01 DIAGNOSIS — Z3A12 12 weeks gestation of pregnancy: Secondary | ICD-10-CM | POA: Diagnosis not present

## 2019-08-01 DIAGNOSIS — Z3401 Encounter for supervision of normal first pregnancy, first trimester: Secondary | ICD-10-CM | POA: Diagnosis not present

## 2019-09-30 DIAGNOSIS — Z23 Encounter for immunization: Secondary | ICD-10-CM | POA: Diagnosis not present

## 2019-09-30 DIAGNOSIS — Z3A20 20 weeks gestation of pregnancy: Secondary | ICD-10-CM | POA: Diagnosis not present

## 2019-09-30 DIAGNOSIS — Z3492 Encounter for supervision of normal pregnancy, unspecified, second trimester: Secondary | ICD-10-CM | POA: Diagnosis not present

## 2019-09-30 DIAGNOSIS — Z363 Encounter for antenatal screening for malformations: Secondary | ICD-10-CM | POA: Diagnosis not present

## 2019-10-28 DIAGNOSIS — Z3482 Encounter for supervision of other normal pregnancy, second trimester: Secondary | ICD-10-CM | POA: Diagnosis not present

## 2019-10-28 DIAGNOSIS — Z3A24 24 weeks gestation of pregnancy: Secondary | ICD-10-CM | POA: Diagnosis not present

## 2019-11-19 DIAGNOSIS — Z23 Encounter for immunization: Secondary | ICD-10-CM | POA: Diagnosis not present

## 2019-11-19 DIAGNOSIS — Z3493 Encounter for supervision of normal pregnancy, unspecified, third trimester: Secondary | ICD-10-CM | POA: Diagnosis not present

## 2019-12-03 DIAGNOSIS — Z36 Encounter for antenatal screening for chromosomal anomalies: Secondary | ICD-10-CM | POA: Diagnosis not present

## 2019-12-03 DIAGNOSIS — O99810 Abnormal glucose complicating pregnancy: Secondary | ICD-10-CM | POA: Diagnosis not present

## 2019-12-13 NOTE — L&D Delivery Note (Signed)
Delivery Note Labor onset: 02/04/2020  Labor Onset Time: 1431 Complete dilation at 3:28 PM  Onset of pushing at 1530 FHR second stage Cat 1 Analgesia/Anesthesia intrapartum: none  Guided pushing with maternal urge. Delivery of a viable female at 69. Fetal head delivered in LOA position compounded by hand. Nuchal cord x1, loose, delivered via somersault manuever. Infant placed on maternal abd, dried, and tactile stim to produce lusty cry.  Cord double clamped and cut after pulsation stopped and cut by father, Emeline Gins.  Cord blood sample collected Arterial cord blood sample N/A.  Placenta delivered Schultz side, intact, with 3 VC.  Placenta to L&D. Uterine tone firm bleeding minimal  No laceration identified.  Anesthesia: none Repair: n/a QBL/EBL (mL): 279 Complications: none APGAR: APGAR (1 MIN): 7   APGAR (5 MINS): 9   APGAR (10 MINS):   Mom to postpartum.  Baby to Couplet care / Skin to Skin.  Roma Schanz MSN, CNM 02/04/2020, 4:37 PM

## 2019-12-17 DIAGNOSIS — R8271 Bacteriuria: Secondary | ICD-10-CM | POA: Diagnosis not present

## 2019-12-18 ENCOUNTER — Encounter: Payer: BC Managed Care – PPO | Attending: Obstetrics and Gynecology | Admitting: Registered"

## 2019-12-18 ENCOUNTER — Other Ambulatory Visit: Payer: Self-pay

## 2019-12-18 DIAGNOSIS — O99810 Abnormal glucose complicating pregnancy: Secondary | ICD-10-CM | POA: Diagnosis not present

## 2019-12-20 ENCOUNTER — Encounter: Payer: Self-pay | Admitting: Registered"

## 2019-12-20 NOTE — Progress Notes (Signed)
Patient was seen on 12/18/19 for Gestational Diabetes self-management class at the [facility]. The following learning objectives were met by the patient during this course:   States the definition of Gestational Diabetes  States why dietary management is important in controlling blood glucose  Describes the effects each nutrient has on blood glucose levels  Demonstrates ability to create a balanced meal plan  Demonstrates carbohydrate counting   States when to check blood glucose levels  Demonstrates proper blood glucose monitoring techniques  States the effect of stress and exercise on blood glucose levels  States the importance of limiting caffeine and abstaining from alcohol and smoking  Blood glucose monitor given: has meter and SMBG prior to class  Patient instructed to monitor glucose levels: FBS: 60 - <95; 1 hour: <140; 2 hour: <120  Patient received handouts:  Nutrition Diabetes and Pregnancy, including carb counting list  Patient will be seen for follow-up as needed.

## 2020-01-14 DIAGNOSIS — Z36 Encounter for antenatal screening for chromosomal anomalies: Secondary | ICD-10-CM | POA: Diagnosis not present

## 2020-01-14 DIAGNOSIS — Z3A36 36 weeks gestation of pregnancy: Secondary | ICD-10-CM | POA: Diagnosis not present

## 2020-01-14 LAB — OB RESULTS CONSOLE GBS: GBS: NEGATIVE

## 2020-01-24 ENCOUNTER — Telehealth (HOSPITAL_COMMUNITY): Payer: Self-pay | Admitting: *Deleted

## 2020-01-24 NOTE — Telephone Encounter (Signed)
Preadmission screen  

## 2020-01-27 ENCOUNTER — Encounter (HOSPITAL_COMMUNITY): Payer: Self-pay | Admitting: *Deleted

## 2020-01-27 ENCOUNTER — Telehealth (HOSPITAL_COMMUNITY): Payer: Self-pay | Admitting: *Deleted

## 2020-01-27 NOTE — Telephone Encounter (Signed)
Preadmission screen  

## 2020-01-30 ENCOUNTER — Other Ambulatory Visit: Payer: Self-pay | Admitting: Obstetrics & Gynecology

## 2020-02-03 ENCOUNTER — Other Ambulatory Visit (HOSPITAL_COMMUNITY)
Admission: RE | Admit: 2020-02-03 | Discharge: 2020-02-03 | Disposition: A | Discharge status: Home / Self Care | Payer: BC Managed Care – PPO | Source: Ambulatory Visit | Attending: Obstetrics and Gynecology | Admitting: Obstetrics and Gynecology

## 2020-02-03 DIAGNOSIS — Z3A39 39 weeks gestation of pregnancy: Secondary | ICD-10-CM | POA: Diagnosis not present

## 2020-02-03 DIAGNOSIS — Z20822 Contact with and (suspected) exposure to covid-19: Secondary | ICD-10-CM | POA: Diagnosis not present

## 2020-02-03 DIAGNOSIS — O24419 Gestational diabetes mellitus in pregnancy, unspecified control: Secondary | ICD-10-CM | POA: Diagnosis not present

## 2020-02-03 DIAGNOSIS — O24420 Gestational diabetes mellitus in childbirth, diet controlled: Secondary | ICD-10-CM | POA: Diagnosis not present

## 2020-02-03 DIAGNOSIS — Z87891 Personal history of nicotine dependence: Secondary | ICD-10-CM | POA: Diagnosis not present

## 2020-02-03 LAB — SARS CORONAVIRUS 2 (TAT 6-24 HRS): SARS Coronavirus 2: NEGATIVE

## 2020-02-04 ENCOUNTER — Other Ambulatory Visit: Payer: Self-pay

## 2020-02-04 ENCOUNTER — Encounter (HOSPITAL_COMMUNITY): Payer: Self-pay | Admitting: Obstetrics & Gynecology

## 2020-02-04 ENCOUNTER — Inpatient Hospital Stay (HOSPITAL_COMMUNITY)
Admission: AD | Admit: 2020-02-04 | Discharge: 2020-02-06 | DRG: 807 | Disposition: A | Discharge status: Home / Self Care | Payer: BC Managed Care – PPO | Attending: Obstetrics & Gynecology | Admitting: Obstetrics & Gynecology

## 2020-02-04 ENCOUNTER — Inpatient Hospital Stay (HOSPITAL_COMMUNITY): Payer: BC Managed Care – PPO

## 2020-02-04 DIAGNOSIS — Z87891 Personal history of nicotine dependence: Secondary | ICD-10-CM | POA: Diagnosis not present

## 2020-02-04 DIAGNOSIS — Z20822 Contact with and (suspected) exposure to covid-19: Secondary | ICD-10-CM | POA: Diagnosis present

## 2020-02-04 DIAGNOSIS — Z3A39 39 weeks gestation of pregnancy: Secondary | ICD-10-CM

## 2020-02-04 DIAGNOSIS — Z3490 Encounter for supervision of normal pregnancy, unspecified, unspecified trimester: Secondary | ICD-10-CM | POA: Diagnosis not present

## 2020-02-04 DIAGNOSIS — O24419 Gestational diabetes mellitus in pregnancy, unspecified control: Secondary | ICD-10-CM | POA: Diagnosis not present

## 2020-02-04 DIAGNOSIS — O24420 Gestational diabetes mellitus in childbirth, diet controlled: Secondary | ICD-10-CM | POA: Diagnosis present

## 2020-02-04 LAB — CBC
HCT: 37 % (ref 36.0–46.0)
Hemoglobin: 12.8 g/dL (ref 12.0–15.0)
MCH: 33.3 pg (ref 26.0–34.0)
MCHC: 34.6 g/dL (ref 30.0–36.0)
MCV: 96.4 fL (ref 80.0–100.0)
Platelets: 141 10*3/uL — ABNORMAL LOW (ref 150–400)
RBC: 3.84 MIL/uL — ABNORMAL LOW (ref 3.87–5.11)
RDW: 13.1 % (ref 11.5–15.5)
WBC: 7.8 10*3/uL (ref 4.0–10.5)
nRBC: 0 % (ref 0.0–0.2)

## 2020-02-04 LAB — RPR: RPR Ser Ql: NONREACTIVE

## 2020-02-04 LAB — ABO/RH: ABO/RH(D): O POS

## 2020-02-04 LAB — GLUCOSE, CAPILLARY
Glucose-Capillary: 88 mg/dL (ref 70–99)
Glucose-Capillary: 73 mg/dL (ref 70–99)
Glucose-Capillary: 82 mg/dL (ref 70–99)

## 2020-02-04 LAB — TYPE AND SCREEN
ABO/RH(D): O POS
Antibody Screen: NEGATIVE

## 2020-02-04 MED ORDER — PHENYLEPHRINE 40 MCG/ML (10ML) SYRINGE FOR IV PUSH (FOR BLOOD PRESSURE SUPPORT): 80.0000 ug | PREFILLED_SYRINGE | INTRAVENOUS | Status: DC | PRN

## 2020-02-04 MED ORDER — ONDANSETRON HCL 4 MG PO TABS: 4.0000 mg | ORAL_TABLET | ORAL | Status: DC | PRN

## 2020-02-04 MED ORDER — EPHEDRINE 5 MG/ML INJ: 10.0000 mg | INTRAVENOUS | Status: DC | PRN

## 2020-02-04 MED ORDER — LACTATED RINGERS IV SOLN: INTRAVENOUS | Status: DC

## 2020-02-04 MED ORDER — PRENATAL MULTIVITAMIN CH
1.0000 | ORAL_TABLET | Freq: Every day | ORAL | Status: DC
  Administered 2020-02-05 – 2020-02-06 (×2): 1 via ORAL
  Filled 2020-02-04 (×2): qty 1

## 2020-02-04 MED ORDER — LIDOCAINE HCL (PF) 1 % IJ SOLN: 30.0000 mL | INTRAMUSCULAR | Status: DC | PRN

## 2020-02-04 MED ORDER — TERBUTALINE SULFATE 1 MG/ML IJ SOLN: 0.2500 mg | Freq: Once | INTRAMUSCULAR | Status: DC | PRN

## 2020-02-04 MED ORDER — MISOPROSTOL 25 MCG QUARTER TABLET: 25.0000 ug | ORAL_TABLET | ORAL | Status: DC | PRN

## 2020-02-04 MED ORDER — FENTANYL CITRATE (PF) 100 MCG/2ML IJ SOLN: 50.0000 ug | INTRAMUSCULAR | Status: DC | PRN

## 2020-02-04 MED ORDER — OXYTOCIN 40 UNITS IN NORMAL SALINE INFUSION - SIMPLE MED: 2.5000 [IU]/h | INTRAVENOUS | Status: DC

## 2020-02-04 MED ORDER — SOD CITRATE-CITRIC ACID 500-334 MG/5ML PO SOLN: 30.0000 mL | ORAL | Status: DC | PRN

## 2020-02-04 MED ORDER — SENNOSIDES-DOCUSATE SODIUM 8.6-50 MG PO TABS
2.0000 | ORAL_TABLET | ORAL | Status: DC
  Administered 2020-02-04 – 2020-02-05 (×2): 2 via ORAL
  Filled 2020-02-04 (×2): qty 2

## 2020-02-04 MED ORDER — LACTATED RINGERS IV SOLN: 500.0000 mL | Freq: Once | INTRAVENOUS | Status: DC

## 2020-02-04 MED ORDER — TETANUS-DIPHTH-ACELL PERTUSSIS 5-2.5-18.5 LF-MCG/0.5 IM SUSP: 0.5000 mL | Freq: Once | INTRAMUSCULAR | Status: DC

## 2020-02-04 MED ORDER — ACETAMINOPHEN 325 MG PO TABS: 650.0000 mg | ORAL_TABLET | ORAL | Status: DC | PRN

## 2020-02-04 MED ORDER — DIPHENHYDRAMINE HCL 25 MG PO CAPS: 25.0000 mg | ORAL_CAPSULE | Freq: Four times a day (QID) | ORAL | Status: DC | PRN

## 2020-02-04 MED ORDER — LACTATED RINGERS IV SOLN: 500.0000 mL | INTRAVENOUS | Status: DC | PRN

## 2020-02-04 MED ORDER — BENZOCAINE-MENTHOL 20-0.5 % EX AERO
1.0000 "application " | INHALATION_SPRAY | CUTANEOUS | Status: DC | PRN
  Administered 2020-02-04: 1 via TOPICAL
  Filled 2020-02-04: qty 56

## 2020-02-04 MED ORDER — COCONUT OIL OIL: 1.0000 "application " | TOPICAL_OIL | Status: DC | PRN

## 2020-02-04 MED ORDER — DIBUCAINE (PERIANAL) 1 % EX OINT: 1.0000 "application " | TOPICAL_OINTMENT | CUTANEOUS | Status: DC | PRN

## 2020-02-04 MED ORDER — OXYTOCIN BOLUS FROM INFUSION
500.0000 mL | Freq: Once | INTRAVENOUS | Status: AC
  Administered 2020-02-04: 500 mL via INTRAVENOUS

## 2020-02-04 MED ORDER — FLEET ENEMA 7-19 GM/118ML RE ENEM: 1.0000 | ENEMA | RECTAL | Status: DC | PRN

## 2020-02-04 MED ORDER — ONDANSETRON HCL 4 MG/2ML IJ SOLN: 4.0000 mg | INTRAMUSCULAR | Status: DC | PRN

## 2020-02-04 MED ORDER — SIMETHICONE 80 MG PO CHEW: 80.0000 mg | CHEWABLE_TABLET | ORAL | Status: DC | PRN

## 2020-02-04 MED ORDER — IBUPROFEN 600 MG PO TABS
600.0000 mg | ORAL_TABLET | Freq: Four times a day (QID) | ORAL | Status: DC
  Administered 2020-02-04 – 2020-02-06 (×8): 600 mg via ORAL
  Filled 2020-02-04 (×8): qty 1

## 2020-02-04 MED ORDER — FENTANYL-BUPIVACAINE-NACL 0.5-0.125-0.9 MG/250ML-% EP SOLN
12.0000 mL/h | EPIDURAL | Status: DC | PRN
  Filled 2020-02-04: qty 250

## 2020-02-04 MED ORDER — DIPHENHYDRAMINE HCL 50 MG/ML IJ SOLN: 12.5000 mg | INTRAMUSCULAR | Status: DC | PRN

## 2020-02-04 MED ORDER — WITCH HAZEL-GLYCERIN EX PADS: 1.0000 "application " | MEDICATED_PAD | CUTANEOUS | Status: DC | PRN

## 2020-02-04 MED ORDER — OXYTOCIN 40 UNITS IN NORMAL SALINE INFUSION - SIMPLE MED
1.0000 m[IU]/min | INTRAVENOUS | Status: DC
  Administered 2020-02-04: 1 m[IU]/min via INTRAVENOUS
  Filled 2020-02-04: qty 1000

## 2020-02-04 MED ORDER — ONDANSETRON HCL 4 MG/2ML IJ SOLN: 4.0000 mg | Freq: Four times a day (QID) | INTRAMUSCULAR | Status: DC | PRN

## 2020-02-04 NOTE — H&P (Signed)
OB ADMISSION/ HISTORY & PHYSICAL:  Admission Date: 02/04/2020  7:47 AM  Admit Diagnosis: IOL for GDM  Andrea Carter is a 29 y.o. female G2P1001 [redacted]w[redacted]d presenting for IOL for GDM. Endorses active FM, denies LOF and vaginal bleeding. Pt has a hx of a shoulder dystocia for 8# 2oz female.   History of current pregnancy: G2P1001   Patient entered care with [facility] at 10+2 wks.   EDC of 02/11/20 was established by LMP and congruent w/ 10+2 wk U/S.   Anatomy scan:  20+6 wks, with normal findings and posterior placenta.   Antenatal testing: for GDM started at 36 weeks Last evaluation: 36 + wks  Significant prenatal events: VTX, EFW 6+10, 71%ILE, AFI 13.9, POSTERIOR PLACENTA Patient Active Problem List   Diagnosis Date Noted  . Gestational diabetes mellitus (GDM) in third trimester 02/04/2020  . Encounter for planned induction of labor 02/04/2020    Prenatal Labs: ABO, Rh: --/--/O POS, O POS Performed at [facility], [address] [place], [place] [address]  [phone]) Antibody: NEG (02/23 7619) Rubella: Immune (08/06 0000)  RPR: Nonreactive (08/06 0000)  HBsAg: Negative (08/06 0000)  HIV: Non-reactive (08/06 0000)  GTT: abnormal 3 hr GBS: Negative/-- (02/02 0000)  GC/CHL: neg/neg Genetics: low-risk female    OB History  Gravida Para Term Preterm AB Living  2 1 1  0 0 1  SAB TAB Ectopic Multiple Live Births  0 0 0 0 1    # Outcome Date GA Lbr Len/2nd Weight Sex Delivery Anes PTL Lv  2 Current           1 Term 12/11/17 [redacted]w[redacted]d 10:24 / 02:13 3705 g M Vag-Spont EPI, Local  LIV    Medical / Surgical History: Past medical history:  Past Medical History:  Diagnosis Date  . Abnormal glucose tolerance test 10/02/2017  . First degree perineal laceration during delivery 12/11/2017  . Gestational diabetes   . Obstetric labial laceration, delivered, current hospitalization 12/11/2017   Bilaterally-Repaired  . Pregnant 04/26/2017  . Shoulder dystocia, delivered, current  hospitalization 12/11/2017  . SVD (spontaneous vaginal delivery) 12/11/2017    Past surgical history:  Past Surgical History:  Procedure Laterality Date  . NO PAST SURGERIES     Family History: History reviewed. No pertinent family history.  Social History:  reports that she quit smoking about 3 years ago. Her smoking use included cigarettes. She has never used smokeless tobacco. She reports previous alcohol use. She reports that she does not use drugs.  Allergies: No allergies on file   Current Medications at time of admission:  Prior to Admission medications   Medication Sig Start Date End Date Taking? Authorizing Provider  Prenatal Vit-Fe Fumarate-FA (PRENATAL MULTIVITAMIN) TABS tablet Take 1 tablet by mouth daily at 12 noon.    [provider]    Review of Systems: Constitutional: Negative   HENT: Negative   Eyes: Negative   Respiratory: Negative   Cardiovascular: Negative   Gastrointestinal: Negative  Genitourinary: neg for bloody show, neg for LOF   Musculoskeletal: Negative   Skin: Negative   Neurological: Negative   Endo/Heme/Allergies: Negative   Psychiatric/Behavioral: Negative    Physical Exam: VS: Blood pressure 109/66, pulse 73, temperature 98.6 F (37 C), temperature source Oral, resp. rate 16, height 5' (1.524 m), weight 60.2 kg, last menstrual period 05/07/2019, unknown if currently breastfeeding. AAO x3, no signs of distress Cardiovascular: RRR Respiratory: Lung fields clear to ausculation GU/GI: Abdomen gravid, non-tender, non-distended, active FM, vertex,  EFW 7# 10oz per Leopold's Extremities: no edema, negative for pain, tenderness, and cords  Cervical exam:Dilation: 4 Effacement (%): 50, 60 Station: -3 Exam by:: Ignacia Felling, RN FHR: baseline rate 135 / variability moderate / accelerations positive / negative decelerations TOCO: 3-4.5 min   Prenatal Transfer Tool  Maternal Diabetes: Yes:  Diabetes Type:  Diet controlled Genetic  Screening: Normal Maternal Ultrasounds/Referrals: Normal Fetal Ultrasounds or other Referrals:  None Maternal Substance Abuse:  No Significant Maternal Medications:  None Significant Maternal Lab Results: Group B Strep negative    Assessment: 29 y.o. G2P1001 [redacted]w[redacted]d IOL for GDM hx of shoulder dystocia  Latent stage of labor A1 GDM FHR category 1 GBS neg Pain management plan: Epidural   Plan:  Admit to L&D Routine admission orders CBG Q 2 hrs Epidural PRN  Dr Alesia Richards notified of admission and plan of care  Arrie Eastern CNM, MSN 02/04/2020 11:14 AM

## 2020-02-05 LAB — CBC
HCT: 35.7 % — ABNORMAL LOW (ref 36.0–46.0)
Hemoglobin: 12.1 g/dL (ref 12.0–15.0)
MCH: 33.2 pg (ref 26.0–34.0)
MCHC: 33.9 g/dL (ref 30.0–36.0)
MCV: 98.1 fL (ref 80.0–100.0)
Platelets: 133 10*3/uL — ABNORMAL LOW (ref 150–400)
RBC: 3.64 MIL/uL — ABNORMAL LOW (ref 3.87–5.11)
RDW: 13.4 % (ref 11.5–15.5)
WBC: 9.8 10*3/uL (ref 4.0–10.5)
nRBC: 0 % (ref 0.0–0.2)

## 2020-02-05 LAB — GLUCOSE, CAPILLARY: Glucose-Capillary: 72 mg/dL (ref 70–99)

## 2020-02-05 NOTE — Lactation Note (Signed)
This note was copied from a baby's chart. Lactation Consultation Note  Patient Name: Andrea Carter RXYVO'P Date: 02/05/2020   P2, 13 hour term female infant. LC entered room mom and infant asleep.   Maternal Data    Feeding Feeding Type: Breast Fed  LATCH Score                   Interventions    Lactation Tools Discussed/Used     Consult Status      Danelle Earthly 02/05/2020, 4:52 AM

## 2020-02-05 NOTE — Progress Notes (Signed)
PPD # 1 S/P NSVD  Live born female  Birth Weight: 7 lb 11.1 oz (3490 g) APGAR: 7, 9  Newborn Delivery   Birth date/time: 02/04/2020 15:39:00 Delivery type: Vaginal, Spontaneous     Baby name: Rulon Eisenmenger Delivering provider: Rhea Pink B  Episiotomy:None   Lacerations:None   circumcision declined  Feeding: breast  Pain control at delivery: None   S:  Reports feeling well, up in room.             Tolerating po/ No nausea or vomiting             Bleeding is decreased             Pain controlled with ibuprofen (OTC)             Up ad lib / ambulatory / voiding without difficulties   O:  A & O x 3, in no apparent distress              VS:  Vitals:   02/04/20 1815 02/04/20 2300 02/05/20 0233 02/05/20 0612  BP: 104/60 (!) 97/58 (!) 90/58 (!) 98/57  Pulse: 69 73 74 62  Resp: 18 19 19 18   Temp: 98.5 F (36.9 C) 98 F (36.7 C) 97.7 F (36.5 C) 98.4 F (36.9 C)  TempSrc: Oral Oral Oral Oral  SpO2: 98% 100% 100% 100%  Weight:      Height:        LABS:  Recent Labs    02/04/20 0823 02/05/20 0626  WBC 7.8 9.8  HGB 12.8 12.1  HCT 37.0 35.7*  PLT 141* 133*    Blood type: --/--/O POS, O POS Performed at [facility], [address] [place], [place]  [phone] (16/96)  Rubella: Immune (08/06 0000)   I&O: I/O last 3 completed shifts: In: -  Out: 279 [Blood:279]          No intake/output data recorded.  Vaccines: TDaP UTD         Flu    UTD   Gen: AAO x 3, NAD  Abdomen: soft, non-tender, non-distended             Fundus: firm, non-tender, U-1  Perineum: intact, no edema  Lochia: small  Extremities: no edema, no calf pain or tenderness    A/P: PPD # 1 29 y.o., 37   Principal Problem:   Postpartum care following vaginal delivery 2/24 Active Problems:   Gestational diabetes mellitus (GDM) in third trimester  - stable CBG PP, f/u 2GTT at [facility] visit   Encounter for planned induction of labor   SVD (2/23)   Doing well - stable status  Routine post  partum orders  - contraception - plans vasectomy, condoms in interim  Anticipate discharge tomorrow    01-04-1990, MSN, CNM 02/05/2020, 11:10 AM

## 2020-02-05 NOTE — Lactation Note (Signed)
This note was copied from a baby's chart. Lactation Consultation Note  Patient Name: Andrea Carter Date: 02/05/2020 Reason for consult: Initial assessment;Term P2, 14 hour term female infant. Infant had 2 voids and 2 stools since birth. Per mom, infant is latching well at breast. Mom is experienced at breastfeeding she breastfeed her 30 year old for 21 months. Mom has Spectra 2 DEBP at home. Infant has breastfeed 4 times since delivery , this is her 5th time latching infant at breast. Mom latched infant on her left breast using the cross cradle hold position, LC suggested mom use pillows bellow infant to offer better support. Infant latched wide mouth, nose and chin touching breast and infant was still breastfeeding after 7 minutes when LC left room. Mom knows to breastfeed infant according to hunger cues, 8 to 12 times within 24 hours and not exceed 3 hours without breastfeeding infant. Mom will continue to do as much STS as possible. Mom knows to call RN or LC if she needs assistance with latching infant at breast. LC discussed community support after discharge: LC hotline, LC out patient clinic and LC breastfeeding online support group.   Maternal Data Formula Feeding for Exclusion: No Has patient been taught Hand Expression?: Yes Does the patient have breastfeeding experience prior to this delivery?: Yes  Feeding Feeding Type: Breast Fed  LATCH Score Latch: Grasps breast easily, tongue down, lips flanged, rhythmical sucking.  Audible Swallowing: Spontaneous and intermittent  Type of Nipple: Everted at rest and after stimulation  Comfort (Breast/Nipple): Soft / non-tender  Hold (Positioning): Assistance needed to correctly position infant at breast and maintain latch.  LATCH Score: 9  Interventions Interventions: Breast feeding basics reviewed;Assisted with latch;Breast compression;Adjust position;Support pillows;Skin to skin;Breast massage;Position  options;Expressed milk  Lactation Tools Discussed/Used WIC Program: No   Consult Status Consult Status: Follow-up Date: 02/05/20 Follow-up type: In-patient    Danelle Earthly 02/05/2020, 5:54 AM

## 2020-02-06 NOTE — Discharge Summary (Signed)
OB Discharge Summary     Patient Name: Andrea Carter DOB: [birth year] MRN: [id number]  Date of admission: 02/04/2020 Delivering MD: Burman Foster B   Date of discharge: 02/06/2020  Admitting diagnosis: Gestational diabetes mellitus (GDM) in third trimester, gestational diabetes method of control unspecified [O24.419] Intrauterine pregnancy: [redacted]w[redacted]d     Secondary diagnosis:  Principal Problem:   Postpartum care following vaginal delivery 2/23 Active Problems:   Gestational diabetes mellitus (GDM) in third trimester   Encounter for planned induction of labor   SVD (2/23)  Additional problems: None     Discharge diagnosis: Term Pregnancy Delivered                                                                                                Post partum procedures:None  Augmentation: AROM and Pitocin  Complications: None  Hospital course:  Induction of Labor With Vaginal Delivery   29 y.o. yo G2P2002 at [redacted]w[redacted]d was admitted to the hospital 02/04/2020 for induction of labor.  Indication for induction: A2 DM.  Patient had an uncomplicated labor course as follows: Membrane Rupture Time/Date: 2:31 PM ,02/04/2020   Intrapartum Procedures: Episiotomy: None [1]                                         Lacerations:  None [1]  Patient had delivery of a Viable infant.  Information for the patient's newborn:  Haylen, Bellotti [[id number]]  Delivery Method: Vag-Spont    02/04/2020  Details of delivery can be found in separate delivery note.  Patient had a routine postpartum course. Patient is discharged home 02/06/20.  Physical exam  Vitals:   02/05/20 0233 02/05/20 0612 02/05/20 2204 02/06/20 0538  BP: (!) 90/58 (!) 98/57 99/61 (!) 90/55  Pulse: 74 62 (!) 57 (!) 58  Resp: 19 18 18 18   Temp: 97.7 F (36.5 C) 98.4 F (36.9 C) 98.1 F (36.7 C) (!) 97.5 F (36.4 C)  TempSrc: Oral Oral Oral Oral  SpO2: 100% 100% 98% 100%  Weight:      Height:       General: alert, cooperative and no  distress Lochia: appropriate Uterine Fundus: firm Incision: N/A DVT Evaluation: No evidence of DVT seen on physical exam. Labs: Lab Results  Component Value Date   WBC 9.8 02/05/2020   HGB 12.1 02/05/2020   HCT 35.7 (L) 02/05/2020   MCV 98.1 02/05/2020   PLT 133 (L) 02/05/2020   CMP Latest Ref Rng & Units 04/07/2015  Glucose 70 - 99 mg/dL 108(H)  BUN 6 - 23 mg/dL 8  Creatinine 0.50 - 1.10 mg/dL 0.60  Sodium 135 - 145 mmol/L 147(H)  Potassium 3.5 - 5.1 mmol/L 3.6  Chloride 96 - 112 mmol/L 114(H)  CO2 19 - 32 mmol/L 24  Calcium 8.4 - 10.5 mg/dL 8.8  Total Protein 6.0 - 8.3 g/dL 7.4  Total Bilirubin 0.3 - 1.2 mg/dL 0.5  Alkaline Phos 39 - 117 U/L 71  AST 0 - 37 U/L 29  ALT 0 - 35 U/L 20    Discharge instruction: per After Visit Summary and "Baby and Me Booklet".  After visit meds:  Allergies as of 02/06/2020   No Known Allergies     Medication List    TAKE these medications   prenatal multivitamin Tabs tablet Take 1 tablet by mouth at bedtime.       Diet: routine diet  Activity: Advance as tolerated. Pelvic rest for 6 weeks.   Outpatient follow up:6 weeks Follow up Appt:No future appointments. Follow up Visit:No follow-ups on file.  Postpartum contraception: Vasectomy  Newborn Data: Live born female  Birth Weight: 7 lb 11.1 oz (3490 g) APGAR: 7, 9  Newborn Delivery   Birth date/time: 02/04/2020 15:39:00 Delivery type: Vaginal, Spontaneous      Baby Feeding: Breast Disposition:home with mother   02/06/2020 Kenney Houseman, CNM

## 2020-02-06 NOTE — Lactation Note (Signed)
This note was copied from a baby's chart. Lactation Consultation Note  Patient Name: Andrea Carter XIPJA'S Date: 02/06/2020 Reason for consult: Follow-up assessment  P2 mother whose infant is now 48 hours old.  Mother breast fed her first child (now 30 years old) for 21 months.  Mother was breast feeding in the cross cradle hold when I arrived.  Baby was latched well with flanged lips, rhythmic sucking noted and swallows observed.  Praised mother for her efforts and a good latch.  Mother will continue feeding 8-12 times/24 hours or sooner if baby shows feeding cues.  Discussed when to allow father to feed baby and feeding intervals.  Mother has a manual pump and a DEBP for home use.    Engorgement prevention/treatment reviewed.  Mother has experienced this with her first child.  She is familiar with how to treat if this occurs again.  Father present.  Mother has our OP phone number for questions after discharge.   Maternal Data    Feeding Feeding Type: Breast Fed  LATCH Score Latch: Grasps breast easily, tongue down, lips flanged, rhythmical sucking.  Audible Swallowing: Spontaneous and intermittent  Type of Nipple: Everted at rest and after stimulation  Comfort (Breast/Nipple): Soft / non-tender  Hold (Positioning): No assistance needed to correctly position infant at breast.  LATCH Score: 10  Interventions Interventions: Assisted with latch;Support pillows;Adjust position  Lactation Tools Discussed/Used     Consult Status Consult Status: Complete Date: 02/06/20 Follow-up type: Call as needed    Tacey Dimaggio R Hector Taft 02/06/2020, 9:29 AM

## 2020-05-12 DIAGNOSIS — Z8632 Personal history of gestational diabetes: Secondary | ICD-10-CM | POA: Diagnosis not present

## 2020-05-12 DIAGNOSIS — Z0000 Encounter for general adult medical examination without abnormal findings: Secondary | ICD-10-CM | POA: Diagnosis not present

## 2020-07-07 IMAGING — US OBSTETRIC <14 WK US AND TRANSVAGINAL OB US
1 series · 15 of 28 positions shown · non-contrast
Comparison: None.

CLINICAL DATA: Initial evaluation for intermittent vaginal
bleeding, early pregnancy.

EXAM:
OBSTETRIC <14 WK US AND TRANSVAGINAL OB US
TECHNIQUE: Both transabdominal and transvaginal ultrasound examinations were
performed for complete evaluation of the gestation as well as the
maternal uterus, adnexal regions, and pelvic cul-de-sac.
Transvaginal technique was performed to assess early pregnancy.

[Series 1: obstetric <14 wk us and transvaginal ob us · 15 of 29 slices shown]
[im 1/29]
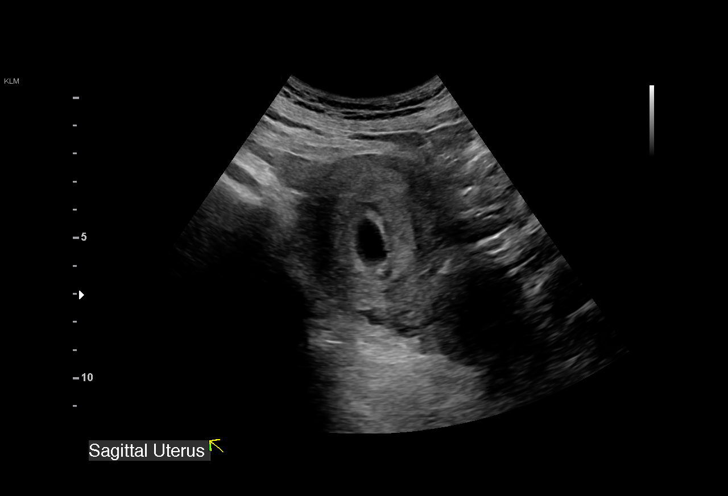
[im 3/29]
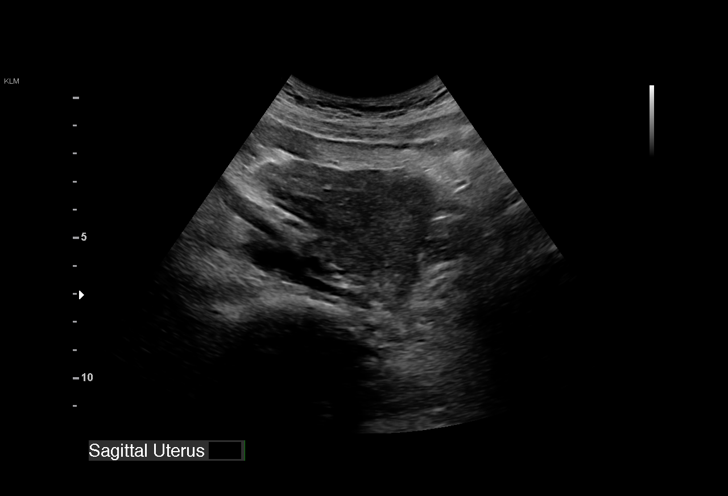
[im 5/29]
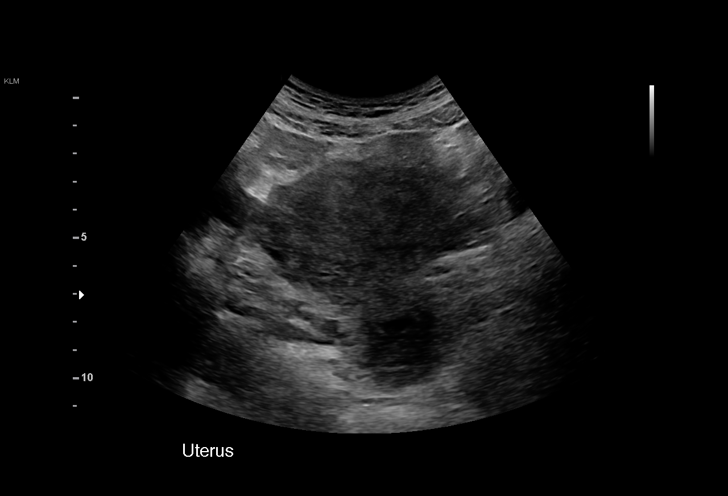
[im 7/29]
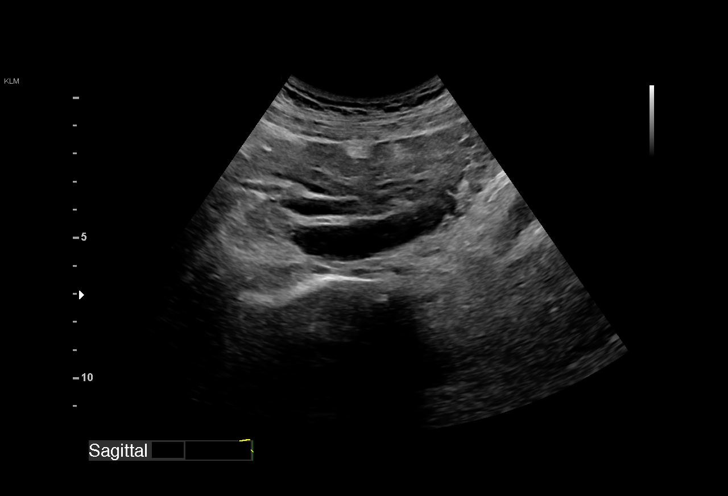
[im 9/29]
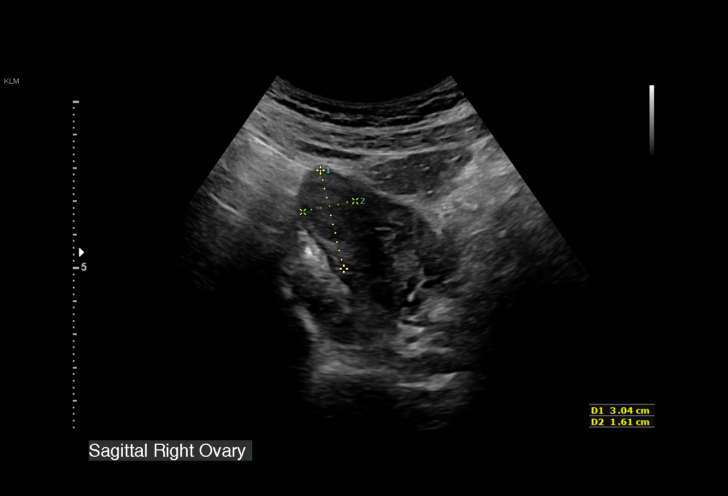
[im 11/29]
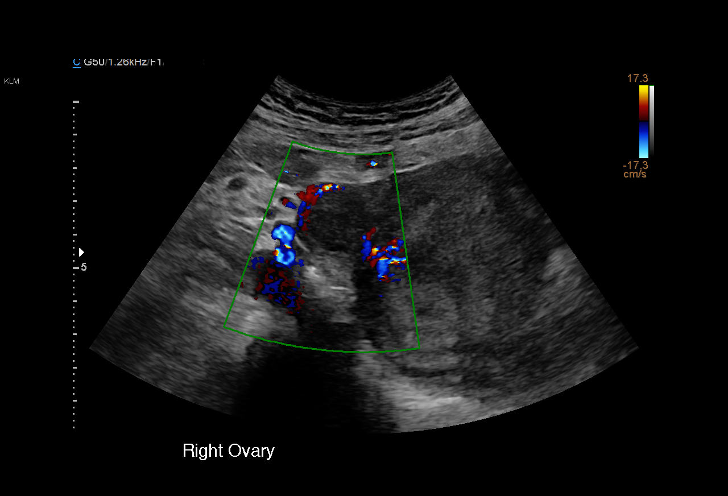
[im 13/29]
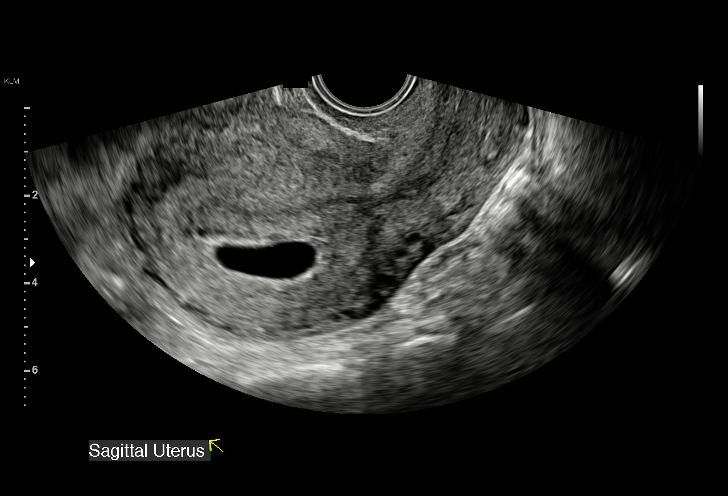
[im 15/29]
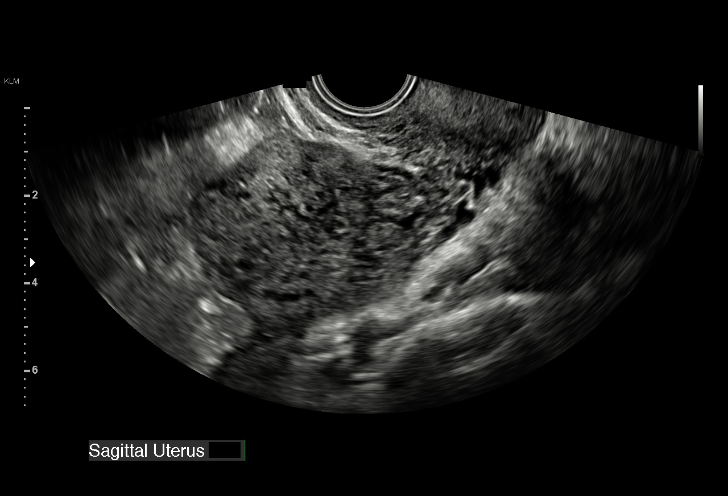
[im 16/29]
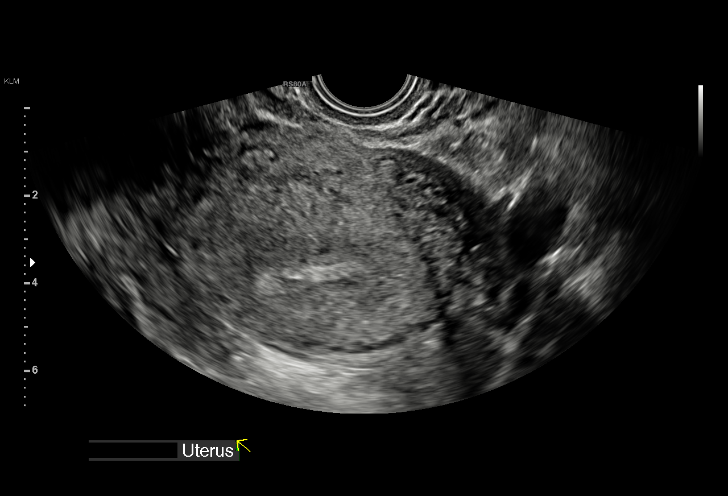
[im 18/29]
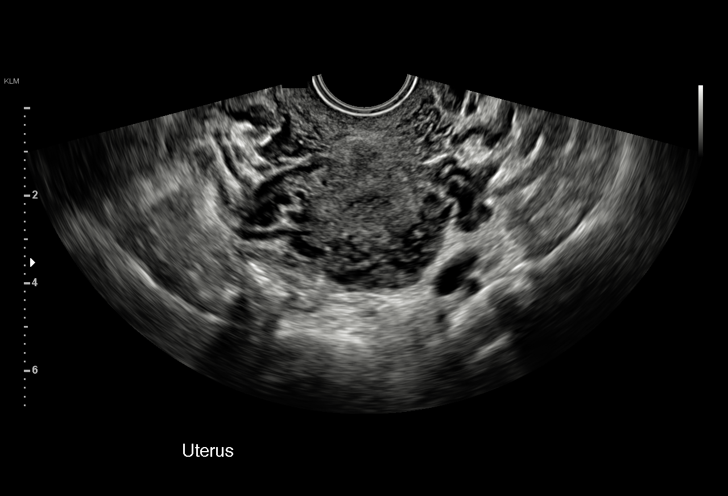
[im 20/29]
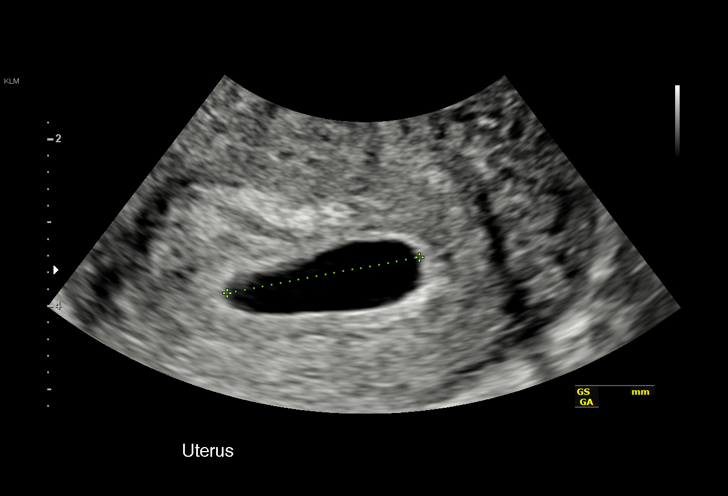
[im 22/29]
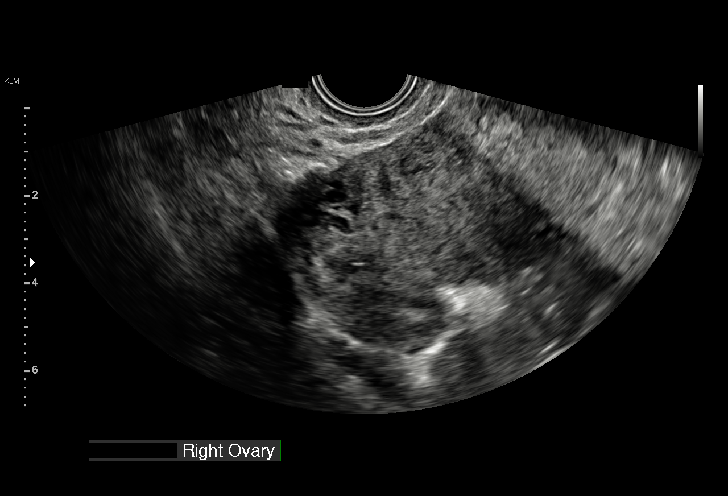
[im 24/29]
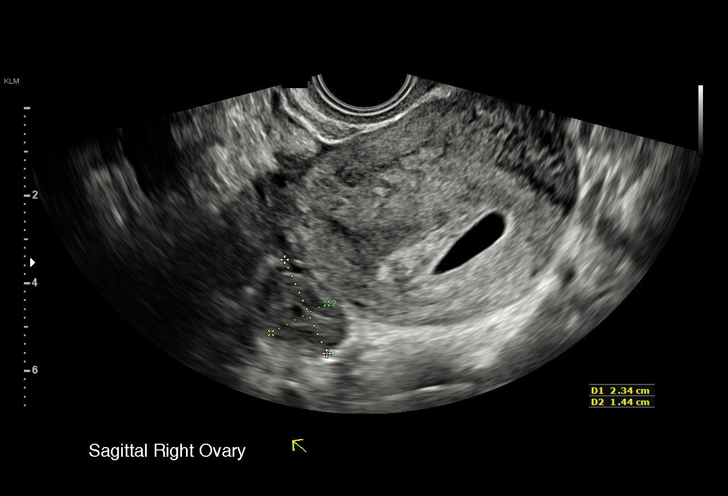
[im 26/29]
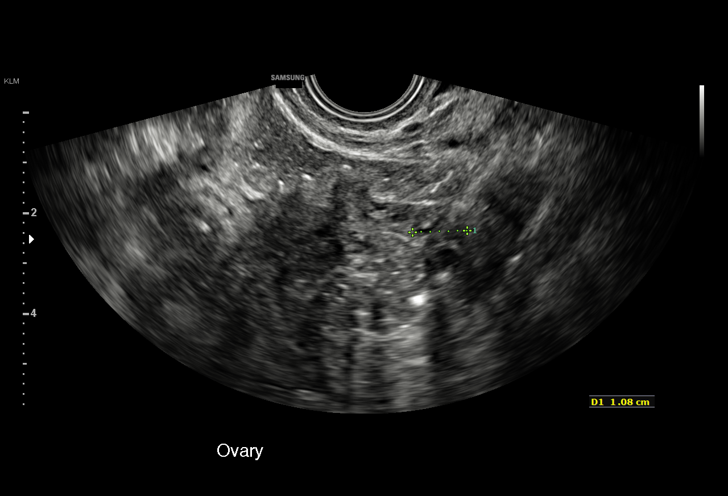
[im 29/29]
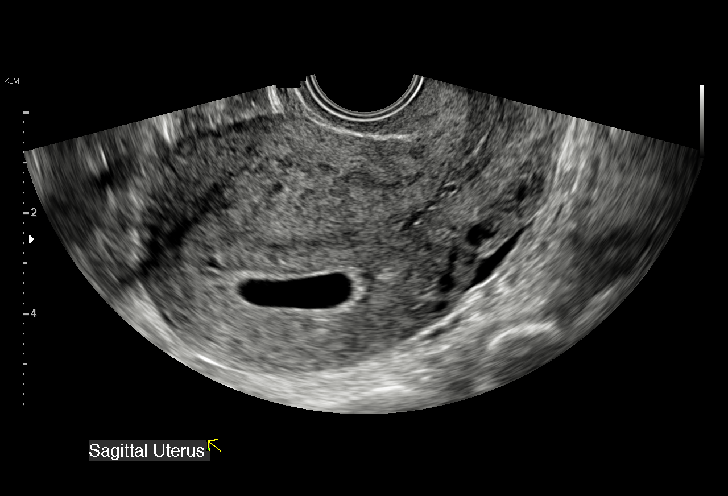

[15 of 28 positions shown; findings below may reference images not displayed]

FINDINGS: Intrauterine gestational sac: Single

Yolk sac:  Negative.

Embryo:  Negative.

Cardiac Activity: Negative.

Heart Rate: N/A  bpm

MSD: 18.7 mm   6 w   5 d

Subchorionic hemorrhage:  None visualized.

Maternal uterus/adnexae: Ovaries are normal in appearance
bilaterally. Small corpus luteal cyst noted on the right. No free
fluid within the pelvis.
IMPRESSION: 1. Single intrauterine gestational sac, with mean sac diameter
measuring 18.7 mm, but no internal yolk sac or fetal pole yet
visualized. Findings are suspicious but not yet definitive for
failed pregnancy. Recommend follow-up US in 10-14 days for
definitive diagnosis. This recommendation follows SRU consensus
guidelines: Diagnostic Criteria for Nonviable Pregnancy Early in the
First Trimester. N Engl J Med 1869; [DATE].
2. No other acute maternal uterine or adnexal abnormality
identified.

## 2021-03-16 ENCOUNTER — Ambulatory Visit (INDEPENDENT_AMBULATORY_CARE_PROVIDER_SITE_OTHER): Payer: 59 | Admitting: Clinical

## 2021-03-16 ENCOUNTER — Other Ambulatory Visit: Payer: Self-pay

## 2021-03-16 DIAGNOSIS — Z630 Problems in relationship with spouse or partner: Secondary | ICD-10-CM

## 2021-03-16 DIAGNOSIS — F329 Major depressive disorder, single episode, unspecified: Secondary | ICD-10-CM

## 2021-03-20 DIAGNOSIS — F329 Major depressive disorder, single episode, unspecified: Secondary | ICD-10-CM | POA: Insufficient documentation

## 2021-03-20 NOTE — Progress Notes (Signed)
Comprehensive Clinical Assessment (CCA) Note  03/16/2021 Andrea Carter [id number]  Chief Complaint:  Chief Complaint  Patient presents with  . Depression  . Anxiety   Visit Diagnosis:  Major depressive disorder, single episode w/ anxious distress Marital conflict    Interpretive Summary:   Client is a 30 year old female presenting to [facility] for outpatient behavioral health services. Client presents by referral of her insurance company for a clinical assessment. Client presents with symptoms of depression and anxiety. Client reported she is presenting to engage in individual therapy to address unresolved thoughts and feelings of her recent separation from her husband. Client reported they have been married for a little over five years but have separated in March 2022 due to finding out about his infidelity and pornography addiction. Client endorses depressed mood, insomnia, feeling on edge, and racing thoughts. Client reported previous history of outpatient therapy while in high school for anxiety and depression. Client reported her current protective factors include her children and supportive family and friends. Client denied history of inpatient treatment for mental health reasons. Client denied substance use history.  Client presented oriented times five, appropriately dressed, and cooperative. Client denied hallucinations, delusions, suicidal and homicidal ideations.  Client was screened for pain, nutritional needs, [place] suicide and the following SDOH:  GAD 7 : Generalized Anxiety Score 03/16/2021  Nervous, Anxious, on Edge 3  Control/stop worrying 2  Worry too much - different things 2  Trouble relaxing 3  Restless 3  Easily annoyed or irritable 2  Afraid - awful might happen 1  Total GAD 7 Score 16  Anxiety Difficulty Somewhat difficult   Flowsheet Row Counselor from 03/16/2021 in [facility]  PHQ-2 Total Score 2     Flowsheet Row Counselor from  03/16/2021 in [facility]  PHQ-9 Total Score 10     Flowsheet Row Counselor from 03/16/2021 in [facility]  C-SSRS RISK CATEGORY No Risk      Treatment recommendations: [facility] due to her reported need to be seen more frequently by a therapist. Client declined psychiatry services.   Therapy provided information on format of appointment (virtual or face to face).  The client was advised to call back or seek an in-person evaluation if the symptoms worsen or if the condition fails to improve as anticipated before the next scheduled appointment. Client was in agreement with treatment recommendations.     CCA Biopsychosocial Intake/Chief Complaint:  Client reported she is presenting due to depression and anxiety symptoms following separation from her husband in March 2022.  Current Symptoms/Problems: Client reported depressed mood, insomnia, and feeling anxious   Patient Reported Schizophrenia/Schizoaffective Diagnosis in Past: No   Type of Services Patient Feels are Needed: individual therapy   Initial Clinical Notes/Concerns: No data recorded  Mental Health Symptoms Depression:  Change in energy/activity; Difficulty Concentrating; Increase/decrease in appetite; Sleep (too much or little); Tearfulness   Duration of Depressive symptoms: Greater than two weeks   Mania:  None   Anxiety:   Tension; Worrying; Sleep   Psychosis:  None   Duration of Psychotic symptoms: No data recorded  Trauma:  None   Obsessions:  None   Compulsions:  None   Inattention:  None   Hyperactivity/Impulsivity:  N/A   Oppositional/Defiant Behaviors:  None   Emotional Irregularity:  None   Other Mood/Personality Symptoms:  No data recorded   Mental Status Exam Appearance and self-care  Stature:  Average   Weight:  Average weight   Clothing:  Casual   Grooming:  Normal    Cosmetic use:  Age appropriate   Posture/gait:  Normal   Motor activity:  Not Remarkable   Sensorium  Attention:  Normal   Concentration:  Normal   Orientation:  X5   Recall/memory:  Normal   Affect and Mood  Affect:  Congruent   Mood:  Depressed   Relating  Eye contact:  Normal   Facial expression:  Depressed   Attitude toward examiner:  Cooperative   Thought and Language  Speech flow: Clear and Coherent   Thought content:  Appropriate to Mood and Circumstances   Preoccupation:  None   Hallucinations:  None   Organization:  No data recorded  [employer] of Knowledge:  Good   Intelligence:  Average   Abstraction:  Normal   Judgement:  Good   Reality Testing:  Adequate   Insight:  Good   Decision Making:  Normal   Social Functioning  Social Maturity:  Responsible   Social Judgement:  Normal   Stress  Stressors:  Family conflict   Coping Ability:  Normal   Skill Deficits:  Activities of daily living; Interpersonal   Supports:  Family; Friends/Service system     Religion: Religion/Spirituality Are You A Religious Person?: No  Leisure/Recreation: Leisure / Recreation Do You Have Hobbies?: Yes Leisure and Hobbies: workout, reading, listening to music  Exercise/Diet: Exercise/Diet Do You Exercise?: Yes Have You Gained or Lost A Significant Amount of Weight in the Past Six Months?: No Do You Follow a Special Diet?: No Do You Have Any Trouble Sleeping?: Yes   CCA Employment/Education Employment/Work Situation: Employment / Work Situation Employment situation: Employed Where is patient currently employed?: [employer] long has patient been employed?: March 2022  Education: Education Is Patient Currently Attending School?: No Did Garment/textile technologist From [employer]?: Yes Did Theme park manager?: Yes What Type of College Degree Do you Have?: Bachelors- [employer] ,Childhood Development   CCA  Family/Childhood History Family and Relationship History: Family history Marital status: Separated Separated, when?: March 2022 What types of issues is patient dealing with in the relationship?: Client reported she and her husband were married for over five years. Client reported she found her husband was cheating and addicted to pornography. Does patient have children?: Yes How many children?: 2 How is patient's relationship with their children?: 26 and 49 years old  Childhood History:  Childhood History By whom was/is the patient raised?: Both parents Additional childhood history information: Client reported she was raised by both parents; she was the youngest of four everything was busy, but it was a stable household going to church and extra -curricular activities. No violence and yelling. Relationship with parents in good. Does patient have siblings?: Yes Number of Siblings: 3 Description of patient's current relationship with siblings: older brothers Did patient suffer any verbal/emotional/physical/sexual abuse as a child?: No Did patient suffer from severe childhood neglect?: No Has patient ever been sexually abused/assaulted/raped as an adolescent or adult?: No Was the patient ever a victim of a crime or a disaster?: No Witnessed domestic violence?: No Has patient been affected by domestic violence as an adult?: No  Child/Adolescent Assessment:     CCA Substance Use Alcohol/Drug Use: Alcohol / Drug Use History of alcohol / drug use?: No history of alcohol / drug abuse  ASAM's:  Six Dimensions of Multidimensional Assessment  Dimension 1:  Acute Intoxication and/or Withdrawal Potential:      Dimension 2:  Biomedical Conditions and Complications:      Dimension 3:  Emotional, Behavioral, or Cognitive Conditions and Complications:     Dimension 4:  Readiness to Change:     Dimension 5:  Relapse, Continued use, or Continued Problem  Potential:     Dimension 6:  Recovery/Living Environment:     ASAM Severity Score:    ASAM Recommended Level of Treatment:     Substance use Disorder (SUD)    Recommendations for Services/Supports/Treatments: Recommendations for Services/Supports/Treatments Recommendations For Services/Supports/Treatments: Individual Therapy  DSM5 Diagnoses: Patient Active Problem List   Diagnosis Date Noted  . Postpartum care following vaginal delivery 2/23 02/05/2020  . Gestational diabetes mellitus (GDM) in third trimester 02/04/2020  . Encounter for planned induction of labor 02/04/2020  . SVD (2/23) 02/04/2020    Patient Centered Plan: Patient is on the following Treatment Plan(s):  Depression   Referrals to Alternative Service(s): Referred to Alternative Service(s):   Place:   Date:   Time:    Referred to Alternative Service(s):   Place:   Date:   Time:    Referred to Alternative Service(s):   Place:   Date:   Time:    Referred to Alternative Service(s):   Place:   Date:   Time:     Loree Fee, LCSW

## 2023-01-26 ENCOUNTER — Other Ambulatory Visit: Payer: Self-pay | Admitting: Family Medicine

## 2023-01-26 DIAGNOSIS — H209 Unspecified iridocyclitis: Secondary | ICD-10-CM

## 2023-01-26 DIAGNOSIS — R200 Anesthesia of skin: Secondary | ICD-10-CM

## 2023-01-26 DIAGNOSIS — H539 Unspecified visual disturbance: Secondary | ICD-10-CM

## 2023-01-26 DIAGNOSIS — R42 Dizziness and giddiness: Secondary | ICD-10-CM

## 2023-01-26 DIAGNOSIS — R110 Nausea: Secondary | ICD-10-CM

## 2024-02-21 ENCOUNTER — Encounter (HOSPITAL_COMMUNITY): Payer: Self-pay | Admitting: Obstetrics and Gynecology

## 2024-02-21 NOTE — Progress Notes (Addendum)
 Spoke w/ via phone for pre-op interview--- Andrea Carter needs dos---- NP thyroid        Carter results------ COVID test -----patient states asymptomatic no test needed Arrive at -------0530 NPO after MN NO Solid Food.   Pre-Surgery Ensure or G2:  Med rec completed Medications to take morning of surgery ----- NP thyroid Diabetic medication -----  GLP1 agonist last dose: GLP1 instructions:  Patient instructed no nail polish to be worn day of surgery Patient instructed to bring photo id and insurance card day of surgery Patient aware to have Driver (ride ) / caregiver    for 24 hours after surgery - mother Andrea Carter Patient Special Instructions ----- Shower with antibacterial soap. Patient takes Naltrexone nightly, per previous instructions she was going to hold starting 3 days prior. Pre-Op special Instructions -----  Patient verbalized understanding of instructions that were given at this phone interview. Patient denies chest pain, sob, fever, cough at the interview.

## 2024-02-22 NOTE — H&P (Signed)
 Preoperative Preadmission History and Physical  Andrea Carter is an 33 y.o. female. [id number] who presents for scheduled surgery - laparoscopic bilateral salpingectomy for sterilization.  Pertinent Gynecological History: Menses: regular Contraception: for sterilization DES exposure: denies Blood transfusions: none Sexually transmitted diseases: no past history Previous GYN Procedures:  none   Last pap: normal Date: 07/2022 OB History: G4, P2022   Menstrual History: Patient's last menstrual period was 02/10/2024 (approximate).    Past Medical History:  Diagnosis Date   Abnormal glucose tolerance test 10/02/2017   ADHD (attention deficit hyperactivity disorder)    First degree perineal laceration during delivery 12/11/2017   Gestational diabetes    Hypothyroidism    Obstetric labial laceration, delivered, current hospitalization 12/11/2017   Bilaterally-Repaired   Pregnant 04/26/2017   Shoulder dystocia, delivered, current hospitalization 12/11/2017   SVD (spontaneous vaginal delivery) 12/11/2017    Past Surgical History:  Procedure Laterality Date   NO PAST SURGERIES      History reviewed. No pertinent family history.  Social History:  reports that she quit smoking about 7 years ago. Her smoking use included cigarettes. She has never used smokeless tobacco. She reports that she does not currently use alcohol. She reports that she does not use drugs.  Allergies: No Known Allergies  No medications prior to admission.   ROS neg unless otherwise noted in HPI  Height 5\' 1"  (1.549 m), weight 50.8 kg, last menstrual period 02/10/2024, not currently breastfeeding. Constitutional:      Appearance: Normal appearance.  HENT:     Head: Normocephalic.  Eyes:     Pupils: Pupils are equal, round. Cardiovascular:     Rate and Rhythm: Normal rate.    Pulses: Normal pulses.  Abdominal:     General: Abdomen is nontender, nondistended. Neurological:     Mental Status: She is  alert.  No results found for this or any previous visit (from the past 2160 hours).  Assessment/Plan: Andrea Carter is an 33 y.o. female. [id number] who presents for scheduled surgery - laparoscopic bilateral salpingectomy for sterilization. We reviewed surgery options - she would prefer salpingectomy. Reviewed lsc BS. She understands that any laparoscopic procedure has risk of needing to be performed open if difficulty controlling bleeding or visualizing structures. R/b/a discussed with patient. She understands risks of bleeding, infection, and injury to surrounding structures. She understands risk of regret.  Plan discharge from PACU.  Tawni Levy 02/22/2024, 9:18 AM

## 2024-03-03 NOTE — Anesthesia Preprocedure Evaluation (Signed)
 Anesthesia Evaluation  Patient identified by MRN, date of birth, ID band Patient awake    Reviewed: Allergy & Precautions, H&P , NPO status , Patient's Chart, lab work & pertinent test results  Airway Mallampati: II  TM Distance: >3 FB Neck ROM: Full    Dental no notable dental hx.    Pulmonary neg pulmonary ROS, former smoker   Pulmonary exam normal breath sounds clear to auscultation       Cardiovascular negative cardio ROS Normal cardiovascular exam Rhythm:Regular Rate:Normal     Neuro/Psych  PSYCHIATRIC DISORDERS Anxiety Depression    negative neurological ROS     GI/Hepatic negative GI ROS, Neg liver ROS,,,  Endo/Other  diabetesHypothyroidism    Renal/GU negative Renal ROS  negative genitourinary   Musculoskeletal negative musculoskeletal ROS (+)    Abdominal   Peds negative pediatric ROS (+)  Hematology negative hematology ROS (+)   Anesthesia Other Findings   Reproductive/Obstetrics negative OB ROS                             Anesthesia Physical Anesthesia Plan  ASA: 2  Anesthesia Plan: General   Post-op Pain Management:    Induction: Intravenous  PONV Risk Score and Plan: 3 and Dexamethasone, Ondansetron and Midazolam  Airway Management Planned: Oral ETT  Additional Equipment:   Intra-op Plan:   Post-operative Plan: Extubation in OR  Informed Consent: I have reviewed the patients History and Physical, chart, labs and discussed the procedure including the risks, benefits and alternatives for the proposed anesthesia with the patient or authorized representative who has indicated his/her understanding and acceptance.     Dental advisory given  Plan Discussed with: CRNA  Anesthesia Plan Comments:        Anesthesia Quick Evaluation

## 2024-03-04 ENCOUNTER — Encounter (HOSPITAL_COMMUNITY): Payer: Self-pay | Admitting: Certified Registered"

## 2024-03-04 ENCOUNTER — Encounter (HOSPITAL_COMMUNITY): Admission: RE | Payer: Self-pay | Source: Home / Self Care

## 2024-03-04 ENCOUNTER — Ambulatory Visit (HOSPITAL_COMMUNITY): Admission: RE | Admit: 2024-03-04 | Payer: Self-pay | Source: Home / Self Care | Admitting: Obstetrics and Gynecology

## 2024-03-04 DIAGNOSIS — Z01810 Encounter for preprocedural cardiovascular examination: Secondary | ICD-10-CM

## 2024-03-04 DIAGNOSIS — Z3009 Encounter for other general counseling and advice on contraception: Secondary | ICD-10-CM

## 2024-03-04 HISTORY — DX: Hypothyroidism, unspecified: E03.9

## 2024-03-04 HISTORY — DX: Attention-deficit hyperactivity disorder, unspecified type: F90.9

## 2024-03-04 SURGERY — SALPINGECTOMY, BILATERAL, LAPAROSCOPIC
Anesthesia: Choice | Laterality: Bilateral

## 2024-04-26 ENCOUNTER — Other Ambulatory Visit (HOSPITAL_COMMUNITY): Payer: Self-pay

## 2024-04-26 MED ORDER — LISDEXAMFETAMINE DIMESYLATE 40 MG PO CHEW
1.0000 | CHEWABLE_TABLET | Freq: Every morning | ORAL | 0 refills | Status: AC
  Filled 2024-04-26: qty 30, 30d supply, fill #0

## 2024-04-27 ENCOUNTER — Other Ambulatory Visit (HOSPITAL_COMMUNITY): Payer: Self-pay

## 2024-09-25 ENCOUNTER — Ambulatory Visit: Admitting: Endocrinology
